# Patient Record
Sex: Female | Born: 1985
Health system: Southern US, Community
[De-identification: ages and names within clinical notes are randomized; demographics above are authoritative.]

## PROBLEM LIST (undated history)

## (undated) ENCOUNTER — Inpatient Hospital Stay (HOSPITAL_COMMUNITY): Payer: Self-pay

## (undated) DIAGNOSIS — Z8619 Personal history of other infectious and parasitic diseases: Secondary | ICD-10-CM

## (undated) DIAGNOSIS — A609 Anogenital herpesviral infection, unspecified: Secondary | ICD-10-CM

## (undated) DIAGNOSIS — R87629 Unspecified abnormal cytological findings in specimens from vagina: Secondary | ICD-10-CM

## (undated) DIAGNOSIS — F419 Anxiety disorder, unspecified: Secondary | ICD-10-CM

## (undated) HISTORY — PX: BREAST ENHANCEMENT SURGERY: SHX7

## (undated) HISTORY — PX: CYST REMOVAL HAND: SHX6279

## (undated) HISTORY — DX: Unspecified abnormal cytological findings in specimens from vagina: R87.629

## (undated) HISTORY — DX: Personal history of other infectious and parasitic diseases: Z86.19

## (undated) HISTORY — PX: BREAST SURGERY: SHX581

## (undated) HISTORY — DX: Anogenital herpesviral infection, unspecified: A60.9

---

## 2011-11-16 ENCOUNTER — Other Ambulatory Visit: Payer: Self-pay | Admitting: Family Medicine

## 2011-11-16 DIAGNOSIS — E049 Nontoxic goiter, unspecified: Secondary | ICD-10-CM

## 2011-11-20 ENCOUNTER — Other Ambulatory Visit: Payer: Self-pay

## 2011-12-11 ENCOUNTER — Ambulatory Visit
Admission: RE | Admit: 2011-12-11 | Discharge: 2011-12-11 | Disposition: A | Discharge status: Home / Self Care | Payer: 59 | Source: Ambulatory Visit | Attending: Family Medicine | Admitting: Family Medicine

## 2011-12-11 ENCOUNTER — Other Ambulatory Visit: Payer: Self-pay

## 2011-12-11 DIAGNOSIS — E049 Nontoxic goiter, unspecified: Secondary | ICD-10-CM

## 2011-12-14 ENCOUNTER — Other Ambulatory Visit: Payer: Self-pay | Admitting: Family Medicine

## 2011-12-14 DIAGNOSIS — E041 Nontoxic single thyroid nodule: Secondary | ICD-10-CM

## 2011-12-16 ENCOUNTER — Ambulatory Visit
Admission: RE | Admit: 2011-12-16 | Discharge: 2011-12-16 | Disposition: A | Discharge status: Home / Self Care | Payer: 59 | Source: Ambulatory Visit | Attending: Family Medicine | Admitting: Family Medicine

## 2011-12-16 ENCOUNTER — Other Ambulatory Visit (HOSPITAL_COMMUNITY)
Admission: RE | Admit: 2011-12-16 | Discharge: 2011-12-16 | Disposition: A | Discharge status: Home / Self Care | Payer: 59 | Source: Ambulatory Visit | Attending: Diagnostic Radiology | Admitting: Diagnostic Radiology

## 2011-12-16 DIAGNOSIS — E041 Nontoxic single thyroid nodule: Secondary | ICD-10-CM

## 2011-12-16 DIAGNOSIS — E049 Nontoxic goiter, unspecified: Secondary | ICD-10-CM | POA: Insufficient documentation

## 2012-01-13 ENCOUNTER — Encounter (HOSPITAL_COMMUNITY): Payer: Self-pay | Admitting: *Deleted

## 2012-01-13 ENCOUNTER — Emergency Department (HOSPITAL_COMMUNITY): Payer: 59

## 2012-01-13 ENCOUNTER — Emergency Department (HOSPITAL_COMMUNITY)
Admission: EM | Admit: 2012-01-13 | Discharge: 2012-01-13 | Disposition: A | Discharge status: Home / Self Care | Payer: 59 | Attending: Emergency Medicine | Admitting: Emergency Medicine

## 2012-01-13 DIAGNOSIS — R55 Syncope and collapse: Secondary | ICD-10-CM | POA: Insufficient documentation

## 2012-01-13 DIAGNOSIS — R404 Transient alteration of awareness: Secondary | ICD-10-CM | POA: Insufficient documentation

## 2012-01-13 DIAGNOSIS — F411 Generalized anxiety disorder: Secondary | ICD-10-CM | POA: Insufficient documentation

## 2012-01-13 HISTORY — DX: Anxiety disorder, unspecified: F41.9

## 2012-01-13 LAB — URINALYSIS, ROUTINE W REFLEX MICROSCOPIC
Glucose, UA: NEGATIVE mg/dL
Protein, ur: NEGATIVE mg/dL
Specific Gravity, Urine: 1.025 (ref 1.005–1.030)
pH: 6 (ref 5.0–8.0)

## 2012-01-13 LAB — COMPREHENSIVE METABOLIC PANEL
Albumin: 4.2 g/dL (ref 3.5–5.2)
BUN: 16 mg/dL (ref 6–23)
Calcium: 9.8 mg/dL (ref 8.4–10.5)
Creatinine, Ser: 0.8 mg/dL (ref 0.50–1.10)
Total Protein: 7.7 g/dL (ref 6.0–8.3)

## 2012-01-13 LAB — CBC
HCT: 40.3 % (ref 36.0–46.0)
Hemoglobin: 14.3 g/dL (ref 12.0–15.0)
WBC: 6.4 10*3/uL (ref 4.0–10.5)

## 2012-01-13 LAB — URINE MICROSCOPIC-ADD ON

## 2012-01-13 LAB — LACTIC ACID, PLASMA: Lactic Acid, Venous: 0.7 mmol/L (ref 0.5–2.2)

## 2012-01-13 LAB — POCT PREGNANCY, URINE: Preg Test, Ur: NEGATIVE

## 2012-01-13 MED ORDER — SODIUM CHLORIDE 0.9 % IV SOLN
1000.0000 mL | Freq: Once | INTRAVENOUS | Status: AC
  Administered 2012-01-13: 1000 mL via INTRAVENOUS

## 2012-01-13 MED ORDER — METOCLOPRAMIDE HCL 5 MG/ML IJ SOLN
10.0000 mg | Freq: Once | INTRAMUSCULAR | Status: AC
  Administered 2012-01-13: 10 mg via INTRAVENOUS
  Filled 2012-01-13: qty 2

## 2012-01-13 MED ORDER — KETOROLAC TROMETHAMINE 30 MG/ML IJ SOLN
30.0000 mg | Freq: Once | INTRAMUSCULAR | Status: AC
  Administered 2012-01-13: 30 mg via INTRAVENOUS
  Filled 2012-01-13: qty 1

## 2012-01-13 MED ORDER — SODIUM CHLORIDE 0.9 % IV SOLN
1000.0000 mL | INTRAVENOUS | Status: DC
  Administered 2012-01-13: 1000 mL via INTRAVENOUS

## 2012-01-13 NOTE — ED Provider Notes (Signed)
History     CSN: [id number]  Arrival date & time 01/13/12  1125   First MD Initiated Contact with Patient 01/13/12 1413      Chief Complaint  Patient presents with  . Loss of Consciousness    (Consider location/radiation/quality/duration/timing/severity/associated sxs/prior treatment) HPI Comments: Patient is a 26 y/o female with no known medical history who comes in with cc of syncope. 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 Pt reports feeling warm, + diophoresis and she had no chest pain, sob, palpitations associated with her symptoms. Pt felt like she was going to pass out, and doesn't recall some of the events. She had no nausea, emesis - but does report moderate headaches. There is no cardiac hx, syncope hx, PE, DVT, strokes. Family hx. Is unremarkable as well. Pt's LMP was in July, and she reports to no heavy bleeds.  During the event patient reports to feeling bilateral upper extremity stiffness, and bilateral lower extremity crampy pain. She also had some epigastric pain that was worse with inspiration when she was asked to lay down. All of these symptoms have since resolved spontaneously.  I spoke with the nurse preceptor Ms. Joy, and she mentions that patient started feeling warm, was diophoretic and turned pale. Pt never really passed out, but mentioned that she felt like she was about to faint. Pt's BP initially was 80s/60s and improved to 90s/60s. She was never tachycardic, hypoxic.  Patient is a 26 y.o. female presenting with syncope. The history is provided by the patient.  Loss of Consciousness Pertinent negatives include no chest pain, no abdominal pain and no shortness of breath.    Past Medical History  Diagnosis Date  . Anxiety     History reviewed. No pertinent past surgical history.  History reviewed. No pertinent family history.  History    Substance Use Topics  . Smoking status: Never Smoker   . Smokeless tobacco: Not on file  . Alcohol Use: No    OB History    Grav Para Term Preterm Abortions TAB SAB Ect Mult Living                  Review of Systems  Constitutional: Negative for fever, chills and diaphoresis.  HENT: Negative for neck pain.   Eyes: Negative for pain.  Respiratory: Negative for chest tightness and shortness of breath.   Cardiovascular: Positive for syncope. Negative for chest pain, palpitations and leg swelling.  Gastrointestinal: Negative for nausea, vomiting and abdominal pain.  Genitourinary: Negative for frequency.  Musculoskeletal: Negative for gait problem.  Skin: Positive for color change.  Neurological: Negative for dizziness and light-headedness.  Hematological: Does not bruise/bleed easily.    Allergies  Review of patient's allergies indicates no known allergies.  Home Medications   Current Outpatient Rx  Name Route Sig Dispense Refill  . DESVENLAFAXINE SUCCINATE ER 50 MG PO TB24 Oral Take 50 mg by mouth daily.      BP 109/76  Pulse 77  Temp 97.9 F (36.6 C) (Oral)  Resp 16  SpO2 99%  LMP 01/04/2012  Physical Exam  Constitutional: She is oriented to person, place, and time. She appears well-developed.  HENT:  Head: Normocephalic and atraumatic.  Eyes: EOM are normal. Pupils are equal, round, and reactive to light.  Neck: Neck supple.  Cardiovascular: Normal rate and regular rhythm.   No murmur heard. Pulmonary/Chest:  Effort normal and breath sounds normal. No respiratory distress.  Abdominal: Soft. Bowel sounds are normal. She exhibits no distension. There is no tenderness. There is no rebound and no guarding.  Musculoskeletal: Normal range of motion.  Neurological: She is alert and oriented to person, place, and time. She has normal strength. She displays no tremor. No cranial nerve deficit or sensory deficit. She exhibits normal muscle tone. Coordination and gait  normal.  Skin: Skin is warm and dry.  Psychiatric: She has a normal mood and affect.    ED Course  Procedures (including critical care time)  Labs Reviewed  COMPREHENSIVE METABOLIC PANEL - Abnormal; Notable for the following:    Glucose, Bld 103 (*)     All other components within normal limits  CBC  POCT PREGNANCY, URINE  URINALYSIS, ROUTINE W REFLEX MICROSCOPIC  POCT CBG (FASTING - GLUCOSE)-MANUAL ENTRY  PREGNANCY, URINE  URINALYSIS, DIPSTICK ONLY   No results found.   No diagnosis found.    MDM   26 y/o healthy woman with no medical history and no ACS risk factors or family hx with of premature CAD comes in with near syncope. Pt had some epigastric chest pain, crampy legs, and had a recent travel via plane to [place]. DDx includes ACS, PE, myocarditis, valcular disorder, neurocardiogenic syncope, TIA, Anemia.  Based on the hx and exam, we favor the dx of neurocardiogenic syncope. Pt is low risk for PE per wells and we will get d-dimer. EKG looks WNL, troponin ordered.   Date: 01/13/2012  Rate: 69  Rhythm: normal sinus rhythm  QRS Axis: normal  Intervals: normal  ST/T Wave abnormalities: normal  Conduction Disutrbances:none  Narrative Interpretation:   Old EKG Reviewed: none available    6:40 PM Pt's headache improved, now feelsl ike a pressure. Pt has been asymptomatic in the ED. Pt's BP are borderline low, but she has been having no complains, usually runs in the 100s. Orthostatic is negative, d-dimer is negative. Troponin was missed - and i ordered that with a lactate to make sure perfusion at the tissue level is also WNL. Repeat questioning leads to no source of infection. Pt is able to see a pcp this week. If pending labs normal, will discharge.  Derwood Kaplan, MD 01/13/12 2131

## 2012-01-13 NOTE — ED Notes (Signed)
[facility], had syncopal episode, at that time had cramping to bilateral hands and legs. Is followed by headache, reports epigastric pain when lying down. cbg 103 pta. No acute distress noted at this time.

## 2012-01-13 NOTE — ED Notes (Signed)
Patient transported to X-ray 

## 2013-07-06 NOTE — L&D Delivery Note (Signed)
Delivery Note At 4:09 PM a viable female was delivered via OA Presentation Apgars 9 9 weight pending Placenta status:spontaneously with 3 vessel cord , .  Cord:  with the following complications:none .  Cord pH: not obtained  Anesthesia: Epidural  Episiotomy: none Lacerations: none Suture Repair: 3.0 chromic Est. Blood Loss (mL): 300  Mom to postpartum.  Baby to Couplet care / Skin to Skin.  Kristeen Lantz L 03/02/2014, 4:19 PM

## 2013-08-02 LAB — OB RESULTS CONSOLE ANTIBODY SCREEN: Antibody Screen: NEGATIVE

## 2013-08-02 LAB — OB RESULTS CONSOLE RPR: RPR: NONREACTIVE

## 2013-08-02 LAB — OB RESULTS CONSOLE HEPATITIS B SURFACE ANTIGEN: Hepatitis B Surface Ag: NEGATIVE

## 2013-08-02 LAB — OB RESULTS CONSOLE ABO/RH: RH Type: POSITIVE

## 2013-08-02 LAB — OB RESULTS CONSOLE HIV ANTIBODY (ROUTINE TESTING): HIV: NONREACTIVE

## 2013-08-02 LAB — OB RESULTS CONSOLE RUBELLA ANTIBODY, IGM: Rubella: IMMUNE

## 2013-08-02 LAB — OB RESULTS CONSOLE GC/CHLAMYDIA
CHLAMYDIA, DNA PROBE: NEGATIVE
Gonorrhea: NEGATIVE

## 2014-01-31 LAB — OB RESULTS CONSOLE GBS: STREP GROUP B AG: NEGATIVE

## 2014-02-12 ENCOUNTER — Inpatient Hospital Stay (HOSPITAL_COMMUNITY)
Admission: AD | Admit: 2014-02-12 | Discharge: 2014-02-12 | Disposition: A | Discharge status: Home / Self Care | Payer: 59 | Source: Ambulatory Visit | Attending: Obstetrics and Gynecology | Admitting: Obstetrics and Gynecology

## 2014-02-12 ENCOUNTER — Encounter (HOSPITAL_COMMUNITY): Payer: Self-pay | Admitting: *Deleted

## 2014-02-12 DIAGNOSIS — R0602 Shortness of breath: Secondary | ICD-10-CM | POA: Diagnosis present

## 2014-02-12 DIAGNOSIS — O26891 Other specified pregnancy related conditions, first trimester: Secondary | ICD-10-CM

## 2014-02-12 DIAGNOSIS — R0600 Dyspnea, unspecified: Secondary | ICD-10-CM

## 2014-02-12 DIAGNOSIS — R0609 Other forms of dyspnea: Secondary | ICD-10-CM

## 2014-02-12 DIAGNOSIS — O9989 Other specified diseases and conditions complicating pregnancy, childbirth and the puerperium: Secondary | ICD-10-CM

## 2014-02-12 DIAGNOSIS — R0989 Other specified symptoms and signs involving the circulatory and respiratory systems: Secondary | ICD-10-CM

## 2014-02-12 DIAGNOSIS — O99891 Other specified diseases and conditions complicating pregnancy: Secondary | ICD-10-CM | POA: Diagnosis not present

## 2014-02-12 LAB — URINALYSIS, ROUTINE W REFLEX MICROSCOPIC
BILIRUBIN URINE: NEGATIVE
Bilirubin Urine: NEGATIVE
GLUCOSE, UA: NEGATIVE mg/dL
Glucose, UA: NEGATIVE mg/dL
HGB URINE DIPSTICK: NEGATIVE
HGB URINE DIPSTICK: NEGATIVE
Ketones, ur: 15 mg/dL — AB
Ketones, ur: 15 mg/dL — AB
LEUKOCYTES UA: NEGATIVE
NITRITE: NEGATIVE
Nitrite: NEGATIVE
PROTEIN: 30 mg/dL — AB
Protein, ur: NEGATIVE mg/dL
Specific Gravity, Urine: 1.025 (ref 1.005–1.030)
Specific Gravity, Urine: 1.025 (ref 1.005–1.030)
UROBILINOGEN UA: 1 mg/dL (ref 0.0–1.0)
Urobilinogen, UA: 0.2 mg/dL (ref 0.0–1.0)
pH: 6 (ref 5.0–8.0)
pH: 6 (ref 5.0–8.0)

## 2014-02-12 LAB — URINE MICROSCOPIC-ADD ON

## 2014-02-12 LAB — CBC WITH DIFFERENTIAL/PLATELET
BASOS ABS: 0 10*3/uL (ref 0.0–0.1)
BASOS PCT: 0 % (ref 0–1)
EOS ABS: 0.1 10*3/uL (ref 0.0–0.7)
Eosinophils Relative: 2 % (ref 0–5)
HEMATOCRIT: 33.6 % — AB (ref 36.0–46.0)
HEMOGLOBIN: 11.6 g/dL — AB (ref 12.0–15.0)
Lymphocytes Relative: 19 % (ref 12–46)
Lymphs Abs: 1.4 10*3/uL (ref 0.7–4.0)
MCH: 31.1 pg (ref 26.0–34.0)
MCHC: 34.5 g/dL (ref 30.0–36.0)
MCV: 90.1 fL (ref 78.0–100.0)
MONO ABS: 0.7 10*3/uL (ref 0.1–1.0)
MONOS PCT: 9 % (ref 3–12)
NEUTROS ABS: 5.2 10*3/uL (ref 1.7–7.7)
NEUTROS PCT: 70 % (ref 43–77)
Platelets: 171 10*3/uL (ref 150–400)
RBC: 3.73 MIL/uL — ABNORMAL LOW (ref 3.87–5.11)
RDW: 12.6 % (ref 11.5–15.5)
WBC: 7.5 10*3/uL (ref 4.0–10.5)

## 2014-02-12 NOTE — MAU Provider Note (Signed)
History     CSN: [id number]  Arrival date and time: 02/12/14 1107   First Provider Initiated Contact with Patient 02/12/14 1152      Chief Complaint  Patient presents with  . Shortness of Breath  . Tachycardia   HPI   Stacy Marquez is 28 y.o. female G3P1011 at [redacted]w[redacted]d who presents with complaints of shortness of breath and tachycardia. She first noticed SOB this morning; denies chest pain. She is also experiencing increased HR associated with the SOB.  She does have a history of anxiety; stopped taking her medication when she became pregnant. She has had on prior episode of tachycardia in early pregnancy. Her HR is normally just under 115 bmp; she feels more recently her HR has been at times in the 140's.   She does not drink much water or fluids during the day.  She drinks 1-2 cans of soda per day and drinks sweet tea during the day.   OB History   Grav Para Term Preterm Abortions TAB SAB Ect Mult Living   3 1 1  1  1   1       Past Medical History  Diagnosis Date  . Anxiety     Past Surgical History  Procedure Laterality Date  . Breast enhancement surgery      History reviewed. No pertinent family history.  History  Substance Use Topics  . Smoking status: Never Smoker   . Smokeless tobacco: Not on file  . Alcohol Use: No    Allergies: No Known Allergies  Prescriptions prior to admission  Medication Sig Dispense Refill  . Melatonin 5 MG CAPS Take 10 mg by mouth daily.       Results for orders placed during the hospital encounter of 02/12/14 (from the past 48 hour(s))  URINALYSIS, ROUTINE W REFLEX MICROSCOPIC     Status: Abnormal   Collection Time    02/12/14 11:45 AM      Result Value Ref Range   Color, Urine YELLOW  YELLOW   APPearance HAZY (*) CLEAR   Specific Gravity, Urine 1.025  1.005 - 1.030   pH 6.0  5.0 - 8.0   Glucose, UA NEGATIVE  NEGATIVE mg/dL   Hgb urine dipstick NEGATIVE  NEGATIVE   Bilirubin Urine NEGATIVE  NEGATIVE   Ketones, ur 15  (*) NEGATIVE mg/dL   Protein, ur 30 (*) NEGATIVE mg/dL   Urobilinogen, UA 1.0  0.0 - 1.0 mg/dL   Nitrite NEGATIVE  NEGATIVE   Leukocytes, UA TRACE (*) NEGATIVE  URINE MICROSCOPIC-ADD ON     Status: Abnormal   Collection Time    02/12/14 11:45 AM      Result Value Ref Range   Squamous Epithelial / LPF MANY (*) RARE   WBC, UA 0-2  <3 WBC/hpf   Bacteria, UA MANY (*) RARE   Urine-Other MUCOUS PRESENT    CBC WITH DIFFERENTIAL     Status: Abnormal   Collection Time    02/12/14 11:50 AM      Result Value Ref Range   WBC 7.5  4.0 - 10.5 K/uL   RBC 3.73 (*) 3.87 - 5.11 MIL/uL   Hemoglobin 11.6 (*) 12.0 - 15.0 g/dL   HCT 33.6 (*) 36.0 - 46.0 %   MCV 90.1  78.0 - 100.0 fL   MCH 31.1  26.0 - 34.0 pg   MCHC 34.5  30.0 - 36.0 g/dL   RDW 12.6  11.5 - 15.5 %   Platelets 171  150 -  400 K/uL   Neutrophils Relative % 70  43 - 77 %   Neutro Abs 5.2  1.7 - 7.7 K/uL   Lymphocytes Relative 19  12 - 46 %   Lymphs Abs 1.4  0.7 - 4.0 K/uL   Monocytes Relative 9  3 - 12 %   Monocytes Absolute 0.7  0.1 - 1.0 K/uL   Eosinophils Relative 2  0 - 5 %   Eosinophils Absolute 0.1  0.0 - 0.7 K/uL   Basophils Relative 0  0 - 1 %   Basophils Absolute 0.0  0.0 - 0.1 K/uL  URINALYSIS, ROUTINE W REFLEX MICROSCOPIC     Status: Abnormal   Collection Time    02/12/14  1:28 PM      Result Value Ref Range   Color, Urine YELLOW  YELLOW   APPearance CLEAR  CLEAR   Specific Gravity, Urine 1.025  1.005 - 1.030   pH 6.0  5.0 - 8.0   Glucose, UA NEGATIVE  NEGATIVE mg/dL   Hgb urine dipstick NEGATIVE  NEGATIVE   Bilirubin Urine NEGATIVE  NEGATIVE   Ketones, ur 15 (*) NEGATIVE mg/dL   Protein, ur NEGATIVE  NEGATIVE mg/dL   Urobilinogen, UA 0.2  0.0 - 1.0 mg/dL   Nitrite NEGATIVE  NEGATIVE   Leukocytes, UA NEGATIVE  NEGATIVE   Comment: MICROSCOPIC NOT DONE ON URINES WITH NEGATIVE PROTEIN, BLOOD, LEUKOCYTES, NITRITE, OR GLUCOSE <1000 mg/dL.     Review of Systems  Constitutional: Negative for fever and chills.   Eyes: Negative for blurred vision.  Respiratory: Positive for shortness of breath.   Cardiovascular: Negative for chest pain and palpitations.       Increased HR  Neurological: Positive for dizziness. Negative for weakness.   Physical Exam   Blood pressure 101/67, pulse 109, temperature 97.8 F (36.6 C), temperature source Oral, resp. rate 22, SpO2 100.00%.  Physical Exam  Constitutional: She is oriented to person, place, and time. She appears well-developed and well-nourished.  Non-toxic appearance. She does not have a sickly appearance. She does not appear ill. No distress.  Eyes: Pupils are equal, round, and reactive to light.  Neck: Normal range of motion. Neck supple.  Cardiovascular: Normal rate, regular rhythm and normal heart sounds.   Respiratory: Effort normal and breath sounds normal. No respiratory distress. She has no wheezes. She has no rales. She exhibits no tenderness.  GI: Soft.  Neurological: She is alert and oriented to person, place, and time.  Skin: Skin is warm. She is not diaphoretic.  Psychiatric: Her speech is normal and behavior is normal. Her mood appears anxious.    Fetal Tracing: Baseline: 145 bpm  Variability: Moderate  Accelerations: 15x15 Decelerations: None Toco: UI    MAU Course  Procedures None  MDM UA shows mild dehydration; 15 of ketones Will have patient repeat UA due to + protein however many squamous cells.    Discussed plan of care with Dr.Holland.  EKG; normal sinus rhythm.  PO hydration  Orthostatic vital signs  1425: Discussed EKG, NST and CBC with Dr. Matthew Saras.  Pt feels better upon discharge home. And is instructed to follow up in the office tomorrow.   Assessment and Plan   A:  1. Shortness of breath due to pregnancy, first trimester    P:  Discharge home in stable condition Call the office tomorrow to schedule a follow up appointment  Return to MAU if symptoms worsen  Increase daily water intake.   RASCH,  JENNIFER IRENE  02/12/2014, 11:57 AM

## 2014-02-12 NOTE — MAU Note (Signed)
Pt states she began having rapid HR & SOB this a.m. While getting ready.  HR registered from 108-140 on her Fit Bit.  Pt denies chest pain but is having SOB with any exertion at all.  Denies bleeding or LOF, has occasional mild uc's.

## 2014-02-25 ENCOUNTER — Inpatient Hospital Stay (HOSPITAL_COMMUNITY)
Admission: AD | Admit: 2014-02-25 | Discharge: 2014-02-25 | Disposition: A | Discharge status: Home / Self Care | Payer: 59 | Source: Ambulatory Visit | Attending: Obstetrics and Gynecology | Admitting: Obstetrics and Gynecology

## 2014-02-25 ENCOUNTER — Encounter (HOSPITAL_COMMUNITY): Payer: Self-pay | Admitting: *Deleted

## 2014-02-25 DIAGNOSIS — O479 False labor, unspecified: Secondary | ICD-10-CM | POA: Diagnosis not present

## 2014-02-25 NOTE — Discharge Instructions (Signed)
Braxton Hicks Contractions °Contractions of the uterus can occur throughout pregnancy. Contractions are not always a sign that you are in labor.  °WHAT ARE BRAXTON HICKS CONTRACTIONS?  °Contractions that occur before labor are called Braxton Hicks contractions, or false labor. Toward the end of pregnancy (32-34 weeks), these contractions can develop more often and may become more forceful. This is not true labor because these contractions do not result in opening (dilatation) and thinning of the cervix. They are sometimes difficult to tell apart from true labor because these contractions can be forceful and people have different pain tolerances. You should not feel embarrassed if you go to the hospital with false labor. Sometimes, the only way to tell if you are in true labor is for your health care provider to look for changes in the cervix. °If there are no prenatal problems or other health problems associated with the pregnancy, it is completely safe to be sent home with false labor and await the onset of true labor. °HOW CAN YOU TELL THE DIFFERENCE BETWEEN TRUE AND FALSE LABOR? °False Labor °· The contractions of false labor are usually shorter and not as hard as those of true labor.   °· The contractions are usually irregular.   °· The contractions are often felt in the front of the lower abdomen and in the groin.   °· The contractions may go away when you walk around or change positions while lying down.   °· The contractions get weaker and are shorter lasting as time goes on.   °· The contractions do not usually become progressively stronger, regular, and closer together as with true labor.   °True Labor °· Contractions in true labor last 30-70 seconds, become very regular, usually become more intense, and increase in frequency.   °· The contractions do not go away with walking.   °· The discomfort is usually felt in the top of the uterus and spreads to the lower abdomen and low back.   °· True labor can be  determined by your health care provider with an exam. This will show that the cervix is dilating and getting thinner.   °WHAT TO REMEMBER °· Keep up with your usual exercises and follow other instructions given by your health care provider.   °· Take medicines as directed by your health care provider.   °· Keep your regular prenatal appointments.   °· Eat and drink lightly if you think you are going into labor.   °· If Braxton Hicks contractions are making you uncomfortable:   °¨ Change your position from lying down or resting to walking, or from walking to resting.   °¨ Sit and rest in a tub of warm water.   °¨ Drink 2-3 glasses of water. Dehydration may cause these contractions.   °¨ Do slow and deep breathing several times an hour.   °WHEN SHOULD I SEEK IMMEDIATE MEDICAL CARE? °Seek immediate medical care if: °· Your contractions become stronger, more regular, and closer together.   °· You have fluid leaking or gushing from your vagina.   °· You have a fever.   °· You pass blood-tinged mucus.   °· You have vaginal bleeding.   °· You have continuous abdominal pain.   °· You have low back pain that you never had before.   °· You feel your baby's head pushing down and causing pelvic pressure.   °· Your baby is not moving as much as it used to.   °Document Released: 06/22/2005 Document Revised: 06/27/2013 Document Reviewed: 04/03/2013 °[employer]® Patient Information ©2015 [employer]. This information is not intended to replace advice given to you by your health care   provider. Make sure you discuss any questions you have with your health care provider. ° °

## 2014-02-25 NOTE — MAU Note (Signed)
Pt presents to MAU with complaints of contractions throughout the day and reports she lost her mucus plug at work today

## 2014-02-28 ENCOUNTER — Encounter (HOSPITAL_COMMUNITY): Payer: Self-pay | Admitting: *Deleted

## 2014-02-28 ENCOUNTER — Telehealth (HOSPITAL_COMMUNITY): Payer: Self-pay | Admitting: *Deleted

## 2014-02-28 NOTE — Telephone Encounter (Signed)
Preadmission screen  

## 2014-03-02 ENCOUNTER — Inpatient Hospital Stay (HOSPITAL_COMMUNITY)
Admission: RE | Admit: 2014-03-02 | Discharge: 2014-03-03 | DRG: 774 | Disposition: A | Discharge status: Home / Self Care | Payer: 59 | Source: Ambulatory Visit | Attending: Obstetrics and Gynecology | Admitting: Obstetrics and Gynecology

## 2014-03-02 ENCOUNTER — Encounter (HOSPITAL_COMMUNITY): Payer: Self-pay

## 2014-03-02 ENCOUNTER — Encounter (HOSPITAL_COMMUNITY): Payer: 59 | Admitting: Anesthesiology

## 2014-03-02 ENCOUNTER — Inpatient Hospital Stay (HOSPITAL_COMMUNITY): Payer: 59 | Admitting: Anesthesiology

## 2014-03-02 DIAGNOSIS — Z833 Family history of diabetes mellitus: Secondary | ICD-10-CM

## 2014-03-02 DIAGNOSIS — Z823 Family history of stroke: Secondary | ICD-10-CM | POA: Diagnosis not present

## 2014-03-02 DIAGNOSIS — A6000 Herpesviral infection of urogenital system, unspecified: Secondary | ICD-10-CM | POA: Diagnosis present

## 2014-03-02 DIAGNOSIS — O98519 Other viral diseases complicating pregnancy, unspecified trimester: Principal | ICD-10-CM | POA: Diagnosis present

## 2014-03-02 DIAGNOSIS — O99344 Other mental disorders complicating childbirth: Secondary | ICD-10-CM | POA: Diagnosis present

## 2014-03-02 DIAGNOSIS — F411 Generalized anxiety disorder: Secondary | ICD-10-CM | POA: Diagnosis present

## 2014-03-02 DIAGNOSIS — O99891 Other specified diseases and conditions complicating pregnancy: Secondary | ICD-10-CM | POA: Diagnosis present

## 2014-03-02 LAB — CBC
HEMATOCRIT: 33.1 % — AB (ref 36.0–46.0)
Hemoglobin: 11.3 g/dL — ABNORMAL LOW (ref 12.0–15.0)
MCH: 30.5 pg (ref 26.0–34.0)
MCHC: 34.1 g/dL (ref 30.0–36.0)
MCV: 89.2 fL (ref 78.0–100.0)
PLATELETS: 161 10*3/uL (ref 150–400)
RBC: 3.71 MIL/uL — ABNORMAL LOW (ref 3.87–5.11)
RDW: 12.7 % (ref 11.5–15.5)
WBC: 7.9 10*3/uL (ref 4.0–10.5)

## 2014-03-02 LAB — RPR

## 2014-03-02 MED ORDER — DIPHENHYDRAMINE HCL 50 MG/ML IJ SOLN: 12.5000 mg | INTRAMUSCULAR | Status: DC | PRN

## 2014-03-02 MED ORDER — FENTANYL 2.5 MCG/ML BUPIVACAINE 1/10 % EPIDURAL INFUSION (WH - ANES)
14.0000 mL/h | INTRAMUSCULAR | Status: DC | PRN
  Administered 2014-03-02: 14 mL/h via EPIDURAL
  Filled 2014-03-02: qty 125

## 2014-03-02 MED ORDER — PHENYLEPHRINE 40 MCG/ML (10ML) SYRINGE FOR IV PUSH (FOR BLOOD PRESSURE SUPPORT): 80.0000 ug | PREFILLED_SYRINGE | INTRAVENOUS | Status: DC | PRN

## 2014-03-02 MED ORDER — LACTATED RINGERS IV SOLN: 500.0000 mL | INTRAVENOUS | Status: DC | PRN

## 2014-03-02 MED ORDER — MEASLES, MUMPS & RUBELLA VAC ~~LOC~~ INJ
0.5000 mL | INJECTION | Freq: Once | SUBCUTANEOUS | Status: DC
  Filled 2014-03-02: qty 0.5

## 2014-03-02 MED ORDER — DIBUCAINE 1 % RE OINT
1.0000 "application " | TOPICAL_OINTMENT | RECTAL | Status: DC | PRN
  Filled 2014-03-02: qty 28

## 2014-03-02 MED ORDER — FLEET ENEMA 7-19 GM/118ML RE ENEM: 1.0000 | ENEMA | Freq: Every day | RECTAL | Status: DC | PRN

## 2014-03-02 MED ORDER — SIMETHICONE 80 MG PO CHEW
80.0000 mg | CHEWABLE_TABLET | ORAL | Status: DC | PRN
Start: 2014-03-02 — End: 2014-03-03

## 2014-03-02 MED ORDER — ACETAMINOPHEN 325 MG PO TABS: 650.0000 mg | ORAL_TABLET | ORAL | Status: DC | PRN

## 2014-03-02 MED ORDER — IBUPROFEN 600 MG PO TABS: 600.0000 mg | ORAL_TABLET | Freq: Four times a day (QID) | ORAL | Status: DC | PRN

## 2014-03-02 MED ORDER — ONDANSETRON HCL 4 MG/2ML IJ SOLN: 4.0000 mg | Freq: Four times a day (QID) | INTRAMUSCULAR | Status: DC | PRN

## 2014-03-02 MED ORDER — LANOLIN HYDROUS EX OINT
TOPICAL_OINTMENT | CUTANEOUS | Status: DC | PRN
Start: 2014-03-02 — End: 2014-03-03

## 2014-03-02 MED ORDER — DIPHENHYDRAMINE HCL 25 MG PO CAPS
25.0000 mg | ORAL_CAPSULE | Freq: Four times a day (QID) | ORAL | Status: DC | PRN
Start: 2014-03-02 — End: 2014-03-03
  Administered 2014-03-02: 25 mg via ORAL
  Filled 2014-03-02: qty 1

## 2014-03-02 MED ORDER — PHENYLEPHRINE 40 MCG/ML (10ML) SYRINGE FOR IV PUSH (FOR BLOOD PRESSURE SUPPORT)
80.0000 ug | PREFILLED_SYRINGE | INTRAVENOUS | Status: DC | PRN
  Filled 2014-03-02: qty 10

## 2014-03-02 MED ORDER — VENLAFAXINE HCL ER 75 MG PO CP24
75.0000 mg | ORAL_CAPSULE | Freq: Every day | ORAL | Status: DC
  Administered 2014-03-03: 75 mg via ORAL
  Filled 2014-03-02 (×2): qty 1

## 2014-03-02 MED ORDER — OXYTOCIN 40 UNITS IN LACTATED RINGERS INFUSION - SIMPLE MED
1.0000 m[IU]/min | INTRAVENOUS | Status: DC
  Administered 2014-03-02: 2 m[IU]/min via INTRAVENOUS
  Filled 2014-03-02: qty 1000

## 2014-03-02 MED ORDER — OXYTOCIN 40 UNITS IN LACTATED RINGERS INFUSION - SIMPLE MED: 62.5000 mL/h | INTRAVENOUS | Status: DC

## 2014-03-02 MED ORDER — OXYTOCIN BOLUS FROM INFUSION: 500.0000 mL | INTRAVENOUS | Status: DC

## 2014-03-02 MED ORDER — FENTANYL 2.5 MCG/ML BUPIVACAINE 1/10 % EPIDURAL INFUSION (WH - ANES): 14.0000 mL/h | INTRAMUSCULAR | Status: DC | PRN

## 2014-03-02 MED ORDER — MEDROXYPROGESTERONE ACETATE 150 MG/ML IM SUSP: 150.0000 mg | INTRAMUSCULAR | Status: DC | PRN

## 2014-03-02 MED ORDER — LACTATED RINGERS IV SOLN
INTRAVENOUS | Status: DC
  Administered 2014-03-02 (×2): via INTRAVENOUS

## 2014-03-02 MED ORDER — BISACODYL 10 MG RE SUPP
10.0000 mg | Freq: Every day | RECTAL | Status: DC | PRN
  Filled 2014-03-02: qty 1

## 2014-03-02 MED ORDER — CITRIC ACID-SODIUM CITRATE 334-500 MG/5ML PO SOLN: 30.0000 mL | ORAL | Status: DC | PRN

## 2014-03-02 MED ORDER — EPHEDRINE 5 MG/ML INJ: 10.0000 mg | INTRAVENOUS | Status: DC | PRN

## 2014-03-02 MED ORDER — LIDOCAINE HCL (PF) 1 % IJ SOLN
INTRAMUSCULAR | Status: DC | PRN
  Administered 2014-03-02: 10 mL

## 2014-03-02 MED ORDER — TERBUTALINE SULFATE 1 MG/ML IJ SOLN: 0.2500 mg | Freq: Once | INTRAMUSCULAR | Status: DC | PRN

## 2014-03-02 MED ORDER — OXYCODONE-ACETAMINOPHEN 5-325 MG PO TABS: 1.0000 | ORAL_TABLET | ORAL | Status: DC | PRN

## 2014-03-02 MED ORDER — ZOLPIDEM TARTRATE 5 MG PO TABS: 5.0000 mg | ORAL_TABLET | Freq: Every evening | ORAL | Status: DC | PRN

## 2014-03-02 MED ORDER — LIDOCAINE HCL (PF) 1 % IJ SOLN: 30.0000 mL | INTRAMUSCULAR | Status: DC | PRN

## 2014-03-02 MED ORDER — WITCH HAZEL-GLYCERIN EX PADS
1.0000 "application " | MEDICATED_PAD | CUTANEOUS | Status: DC | PRN
  Administered 2014-03-03 (×2): 1 via TOPICAL

## 2014-03-02 MED ORDER — IBUPROFEN 600 MG PO TABS
600.0000 mg | ORAL_TABLET | Freq: Four times a day (QID) | ORAL | Status: DC
  Administered 2014-03-02 – 2014-03-03 (×4): 600 mg via ORAL
  Filled 2014-03-02 (×4): qty 1

## 2014-03-02 MED ORDER — SENNOSIDES-DOCUSATE SODIUM 8.6-50 MG PO TABS
2.0000 | ORAL_TABLET | ORAL | Status: DC
  Administered 2014-03-02: 2 via ORAL
  Filled 2014-03-02: qty 2

## 2014-03-02 MED ORDER — BENZOCAINE-MENTHOL 20-0.5 % EX AERO
1.0000 | INHALATION_SPRAY | CUTANEOUS | Status: DC | PRN
Start: 2014-03-02 — End: 2014-03-03
  Administered 2014-03-02: 1 via TOPICAL
  Filled 2014-03-02 (×2): qty 56

## 2014-03-02 MED ORDER — FLEET ENEMA 7-19 GM/118ML RE ENEM: 1.0000 | ENEMA | RECTAL | Status: DC | PRN

## 2014-03-02 MED ORDER — PRENATAL MULTIVITAMIN CH
1.0000 | ORAL_TABLET | Freq: Every day | ORAL | Status: DC
  Administered 2014-03-03: 1 via ORAL
  Filled 2014-03-02: qty 1

## 2014-03-02 MED ORDER — TETANUS-DIPHTH-ACELL PERTUSSIS 5-2.5-18.5 LF-MCG/0.5 IM SUSP
0.5000 mL | Freq: Once | INTRAMUSCULAR | Status: DC
  Filled 2014-03-02: qty 0.5

## 2014-03-02 MED ORDER — ONDANSETRON HCL 4 MG/2ML IJ SOLN
4.0000 mg | INTRAMUSCULAR | Status: DC | PRN
Start: 2014-03-02 — End: 2014-03-03

## 2014-03-02 MED ORDER — ONDANSETRON HCL 4 MG PO TABS: 4.0000 mg | ORAL_TABLET | ORAL | Status: DC | PRN

## 2014-03-02 MED ORDER — OXYCODONE-ACETAMINOPHEN 5-325 MG PO TABS
1.0000 | ORAL_TABLET | ORAL | Status: DC | PRN
  Administered 2014-03-03 (×2): 1 via ORAL
  Filled 2014-03-02 (×2): qty 1

## 2014-03-02 MED ORDER — LACTATED RINGERS IV SOLN: 500.0000 mL | Freq: Once | INTRAVENOUS | Status: DC

## 2014-03-02 NOTE — Anesthesia Procedure Notes (Signed)

## 2014-03-02 NOTE — Anesthesia Preprocedure Evaluation (Signed)
Anesthesia Evaluation Anesthesia Physical Anesthesia Plan  ASA: II  Anesthesia Plan: Epidural   Post-op Pain Management:    Induction:   Airway Management Planned:   Additional Equipment:   Intra-op Plan:   Post-operative Plan:   Informed Consent: I have reviewed the patients History and Physical, chart, labs and discussed the procedure including the risks, benefits and alternatives for the proposed anesthesia with the patient or authorized representative who has indicated his/her understanding and acceptance.   Dental Advisory Given  Plan Discussed with:   Anesthesia Plan Comments: (Labs checked- platelets confirmed with RN in room. Fetal heart tracing, per RN, reported to be stable enough for sitting procedure. Discussed epidural, and patient consents to the procedure:  included risk of possible headache,backache, failed block, allergic reaction, and nerve injury. This patient was asked if she had any questions or concerns before the procedure started.)        Anesthesia Quick Evaluation

## 2014-03-02 NOTE — H&P (Signed)
Stacy Marquez is a 28 y.o. G 3 P 1011 at 39 weeks and 4 days presents for elective induction.History of HSV no current outbreak. History of vaginal delivery - 14 hour labor. Maternal Medical History:  Fetal activity: Perceived fetal activity is normal.      OB History   Grav Para Term Preterm Abortions TAB SAB Ect Mult Living   3 1 1  1  1   1      Past Medical History  Diagnosis Date  . Anxiety   . HSV (herpes simplex virus) anogenital infection     occ vulvar outbreaks  . Vaginal Pap smear, abnormal   . Hx of varicella    Past Surgical History  Procedure Laterality Date  . Breast enhancement surgery    . Breast surgery      aug [id number]  . Cyst removal hand     Family History: family history includes Cancer in her maternal grandmother and maternal uncle; Diabetes in her maternal grandfather and maternal grandmother; Stroke in her maternal grandfather. Social History:  reports that she has never smoked. She does not have any smokeless tobacco history on file. She reports that she does not drink alcohol or use illicit drugs.   Prenatal Transfer Tool  Maternal Diabetes: No Genetic Screening: Normal Maternal Ultrasounds/Referrals: Normal Fetal Ultrasounds or other Referrals:  None Maternal Substance Abuse:  No Significant Maternal Medications:  None Significant Maternal Lab Results:  None Other Comments:  None  Review of Systems  All other systems reviewed and are negative.   Dilation: 3 Effacement (%): 80 Station: -2 Exam by:: Kawehi Hostetter Blood pressure 107/66, pulse 88, resp. rate 20, height 5\' 6"  (1.676 m), weight 76.204 kg (168 lb). Maternal Exam:  Abdomen: Fetal presentation: vertex     Fetal Exam Fetal State Assessment: Category I - tracings are normal.     Physical Exam  Nursing note and vitals reviewed. Constitutional: She appears well-developed and well-nourished.  HENT:  Head: Normocephalic.  Eyes: Pupils are equal, round, and reactive to light.   Neck: Normal range of motion.  Cardiovascular: Normal rate and regular rhythm.   Respiratory: Effort normal.    Prenatal labs: ABO, Rh: A/Positive/-- (01/28 0000) Antibody: Negative (01/28 0000) Rubella: Immune (01/28 0000) RPR: Nonreactive (01/28 0000)  HBsAg: Negative (01/28 0000)  HIV: Non-reactive (01/28 0000)  GBS: Negative (07/29 0000)   Assessment/Plan: IUP at term Favorable cervix Epidural Pitocin prn Vendela Troung L 03/02/2014, 8:15 AM

## 2014-03-03 LAB — CBC
HEMATOCRIT: 31.2 % — AB (ref 36.0–46.0)
Hemoglobin: 10.6 g/dL — ABNORMAL LOW (ref 12.0–15.0)
MCH: 29.9 pg (ref 26.0–34.0)
MCHC: 34 g/dL (ref 30.0–36.0)
MCV: 88.1 fL (ref 78.0–100.0)
PLATELETS: 150 10*3/uL (ref 150–400)
RBC: 3.54 MIL/uL — ABNORMAL LOW (ref 3.87–5.11)
RDW: 12.7 % (ref 11.5–15.5)
WBC: 9.1 10*3/uL (ref 4.0–10.5)

## 2014-03-03 MED ORDER — DESVENLAFAXINE SUCCINATE ER 50 MG PO TB24: 50.0000 mg | ORAL_TABLET | Freq: Every day | ORAL | Status: AC

## 2014-03-03 MED ORDER — IBUPROFEN 600 MG PO TABS: 600.0000 mg | ORAL_TABLET | Freq: Four times a day (QID) | ORAL | Status: AC

## 2014-03-03 NOTE — Discharge Summary (Signed)
Obstetric Discharge Summary Reason for Admission: induction of labor Prenatal Procedures: none Intrapartum Procedures: spontaneous vaginal delivery Postpartum Procedures: none Complications-Operative and Postpartum: none Hemoglobin  Date Value Ref Range Status  03/03/2014 10.6* 12.0 - 15.0 g/dL Final     HCT  Date Value Ref Range Status  03/03/2014 31.2* 36.0 - 46.0 % Final    Physical Exam:  General: alert Lochia: appropriate Uterine Fundus: firm Incision: healing well, no significant drainage, no dehiscence, no significant erythema DVT Evaluation: No evidence of DVT seen on physical exam.  Discharge Diagnoses: Term Pregnancy-delivered  Discharge Information: Date: 03/03/2014 Activity: pelvic rest Diet: routine Medications: Ibuprofen and pristiq Condition: improved Instructions: refer to practice specific booklet Discharge to: home   Newborn Data: Live born female  Birth Weight: 6 lb 11.1 oz (3035 g) APGAR: 8, 9  Home with mother.  Stacy Marquez L 03/03/2014, 8:32 AM

## 2014-03-03 NOTE — Progress Notes (Signed)

## 2014-03-03 NOTE — Anesthesia Postprocedure Evaluation (Signed)
  Anesthesia Post-op Note  Patient: Industrial/product designer  Procedure(s) Performed: * No procedures listed *  Patient Location: PACU and Mother/Baby  Anesthesia Type:Epidural  Level of Consciousness: awake, alert  and oriented  Airway and Oxygen Therapy: Patient Spontanous Breathing  Post-op Pain: mild  Post-op Assessment: Patient's Cardiovascular Status Stable, Respiratory Function Stable, No signs of Nausea or vomiting, Adequate PO intake, Pain level controlled and No headache  Post-op Vital Signs: Reviewed and stable  Last Vitals:  Filed Vitals:   03/03/14 0530  BP: 105/59  Pulse: 74  Temp: 36.6 C  Resp: 18    Complications: No apparent anesthesia complications

## 2014-03-06 ENCOUNTER — Inpatient Hospital Stay (HOSPITAL_COMMUNITY): Admission: RE | Admit: 2014-03-06 | Payer: 59 | Source: Ambulatory Visit

## 2014-05-01 ENCOUNTER — Other Ambulatory Visit: Payer: Self-pay | Admitting: Obstetrics and Gynecology

## 2014-05-02 LAB — CYTOLOGY - PAP

## 2014-05-07 ENCOUNTER — Encounter (HOSPITAL_COMMUNITY): Payer: Self-pay

## 2014-06-18 ENCOUNTER — Other Ambulatory Visit (INDEPENDENT_AMBULATORY_CARE_PROVIDER_SITE_OTHER): Payer: Self-pay

## 2014-06-18 DIAGNOSIS — E042 Nontoxic multinodular goiter: Secondary | ICD-10-CM

## 2014-06-20 ENCOUNTER — Other Ambulatory Visit: Payer: 59

## 2014-06-21 ENCOUNTER — Ambulatory Visit
Admission: RE | Admit: 2014-06-21 | Discharge: 2014-06-21 | Disposition: A | Discharge status: Home / Self Care | Payer: 59 | Source: Ambulatory Visit | Attending: Surgery | Admitting: Surgery

## 2014-06-21 DIAGNOSIS — E042 Nontoxic multinodular goiter: Secondary | ICD-10-CM

## 2014-07-12 ENCOUNTER — Other Ambulatory Visit (INDEPENDENT_AMBULATORY_CARE_PROVIDER_SITE_OTHER): Payer: Self-pay

## 2014-07-12 DIAGNOSIS — E041 Nontoxic single thyroid nodule: Secondary | ICD-10-CM

## 2014-07-13 ENCOUNTER — Other Ambulatory Visit: Payer: Self-pay | Admitting: Surgery

## 2014-07-13 DIAGNOSIS — E041 Nontoxic single thyroid nodule: Secondary | ICD-10-CM

## 2014-07-18 ENCOUNTER — Other Ambulatory Visit: Payer: Self-pay | Admitting: Surgery

## 2014-07-18 DIAGNOSIS — E041 Nontoxic single thyroid nodule: Secondary | ICD-10-CM

## 2014-07-19 ENCOUNTER — Other Ambulatory Visit (HOSPITAL_COMMUNITY)
Admission: RE | Admit: 2014-07-19 | Discharge: 2014-07-19 | Disposition: A | Discharge status: Home / Self Care | Payer: 59 | Source: Ambulatory Visit | Attending: Interventional Radiology | Admitting: Interventional Radiology

## 2014-07-19 ENCOUNTER — Ambulatory Visit
Admission: RE | Admit: 2014-07-19 | Discharge: 2014-07-19 | Disposition: A | Discharge status: Home / Self Care | Payer: 59 | Source: Ambulatory Visit | Attending: Surgery | Admitting: Surgery

## 2014-07-19 DIAGNOSIS — E041 Nontoxic single thyroid nodule: Secondary | ICD-10-CM | POA: Diagnosis present

## 2014-07-23 ENCOUNTER — Ambulatory Visit (INDEPENDENT_AMBULATORY_CARE_PROVIDER_SITE_OTHER): Payer: Self-pay | Admitting: Surgery

## 2014-07-24 NOTE — Progress Notes (Signed)
Quick Note:  Result of biopsy called to patient and discussed options for management - thyroid lobectomy versus observation. Patient wishes to proceed with surgical resection.  The risks and benefits of the procedure have been discussed at length with the patient. The patient understands the proposed procedure, potential alternative treatments, and the course of recovery to be expected. All of the patient's questions have been answered at this time. The patient wishes to proceed with surgery.  Earnstine Regal, MD, [facility] Office: [phone]  ______

## 2014-08-21 ENCOUNTER — Inpatient Hospital Stay (HOSPITAL_COMMUNITY)
Admission: RE | Admit: 2014-08-21 | Discharge: 2014-08-21 | Disposition: A | Discharge status: Home / Self Care | Payer: 59 | Source: Ambulatory Visit

## 2014-08-24 ENCOUNTER — Encounter (HOSPITAL_COMMUNITY): Payer: Self-pay

## 2014-08-24 ENCOUNTER — Encounter (HOSPITAL_COMMUNITY)
Admission: RE | Admit: 2014-08-24 | Discharge: 2014-08-24 | Disposition: A | Discharge status: Home / Self Care | Payer: 59 | Source: Ambulatory Visit | Attending: Surgery | Admitting: Surgery

## 2014-08-24 ENCOUNTER — Ambulatory Visit (HOSPITAL_COMMUNITY)
Admission: RE | Admit: 2014-08-24 | Discharge: 2014-08-24 | Disposition: A | Discharge status: Home / Self Care | Payer: 59 | Source: Ambulatory Visit | Attending: Surgery | Admitting: Surgery

## 2014-08-24 DIAGNOSIS — F1099 Alcohol use, unspecified with unspecified alcohol-induced disorder: Secondary | ICD-10-CM | POA: Diagnosis not present

## 2014-08-24 DIAGNOSIS — F419 Anxiety disorder, unspecified: Secondary | ICD-10-CM | POA: Diagnosis not present

## 2014-08-24 DIAGNOSIS — D440 Neoplasm of uncertain behavior of thyroid gland: Secondary | ICD-10-CM | POA: Diagnosis not present

## 2014-08-24 DIAGNOSIS — Z01818 Encounter for other preprocedural examination: Secondary | ICD-10-CM

## 2014-08-24 DIAGNOSIS — F1590 Other stimulant use, unspecified, uncomplicated: Secondary | ICD-10-CM | POA: Diagnosis not present

## 2014-08-24 LAB — BASIC METABOLIC PANEL
Anion gap: 9 (ref 5–15)
BUN: 15 mg/dL (ref 6–23)
CALCIUM: 9.4 mg/dL (ref 8.4–10.5)
CO2: 27 mmol/L (ref 19–32)
Chloride: 103 mmol/L (ref 96–112)
Creatinine, Ser: 0.75 mg/dL (ref 0.50–1.10)
GFR calc Af Amer: >90 mL/min (ref >90)
Glucose, Bld: 77 mg/dL (ref 70–99)
Potassium: 3.9 mmol/L (ref 3.5–5.1)
Sodium: 139 mmol/L (ref 135–145)

## 2014-08-24 LAB — HCG, SERUM, QUALITATIVE: PREG SERUM: NEGATIVE

## 2014-08-24 LAB — CBC
HEMATOCRIT: 41.8 % (ref 36.0–46.0)
Hemoglobin: 14.5 g/dL (ref 12.0–15.0)
MCH: 30.5 pg (ref 26.0–34.0)
MCHC: 34.7 g/dL (ref 30.0–36.0)
MCV: 88 fL (ref 78.0–100.0)
PLATELETS: 222 10*3/uL (ref 150–400)
RBC: 4.75 MIL/uL (ref 3.87–5.11)
RDW: 12 % (ref 11.5–15.5)
WBC: 4.3 10*3/uL (ref 4.0–10.5)

## 2014-08-24 NOTE — Progress Notes (Signed)
Quick Note:  These results are acceptable for scheduled surgery.  Emiley Digiacomo M. Mildreth Reek, MD, [facility] Office: [phone]   ______ 

## 2014-08-24 NOTE — Progress Notes (Signed)
Quick Note:  Pre-operative chest x-ray is acceptable for scheduled surgery.  Amiya Escamilla M. Kela Baccari, MD, [facility] Office: [phone]   ______ 

## 2014-08-24 NOTE — Pre-Procedure Instructions (Signed)
Stacy Marquez  08/24/2014   Your procedure is scheduled on: 08/28/2014  Report to [facility] Admitting at 8 AM.  Call this number if you have problems the morning of surgery: [phone]   Remember:   Do not eat food or drink liquids after midnight.   Take these medicines the morning of surgery with A SIP OF WATER: desvenlafaxine (PRISTIQ)   Do not wear jewelry, make-up or nail polish.  Do not wear lotions, powders, or perfumes. You may wear deodorant.  Do not shave 48 hours prior to surgery. Men may shave face and neck.  Do not bring valuables to the hospital.  [facility] is not responsible                  for any belongings or valuables.               Contacts, dentures or bridgework may not be worn into surgery.  Leave suitcase in the car. After surgery it may be brought to your room.  For patients admitted to the hospital, discharge time is determined by your                treatment team.               Patients discharged the day of surgery will not be allowed to drive  home.  Name and phone number of your driver: family/friend :    Please read over the following fact sheets that you were given: Pain Booklet, Coughing and Deep Breathing and Surgical Site Infection Prevention

## 2014-08-27 MED ORDER — CEFAZOLIN SODIUM-DEXTROSE 2-3 GM-% IV SOLR
2.0000 g | INTRAVENOUS | Status: AC
  Administered 2014-08-28: 2 g via INTRAVENOUS
  Filled 2014-08-27: qty 50

## 2014-08-28 ENCOUNTER — Observation Stay (HOSPITAL_COMMUNITY)
Admission: RE | Admit: 2014-08-28 | Discharge: 2014-08-29 | Disposition: A | Discharge status: Home / Self Care | Payer: 59 | Source: Ambulatory Visit | Attending: Surgery | Admitting: Surgery

## 2014-08-28 ENCOUNTER — Ambulatory Visit (HOSPITAL_COMMUNITY): Payer: 59 | Admitting: Anesthesiology

## 2014-08-28 ENCOUNTER — Encounter (HOSPITAL_COMMUNITY)
Admission: RE | Disposition: A | Discharge status: Home / Self Care | Payer: Self-pay | Source: Ambulatory Visit | Attending: Surgery

## 2014-08-28 ENCOUNTER — Encounter (HOSPITAL_COMMUNITY): Payer: Self-pay | Admitting: *Deleted

## 2014-08-28 DIAGNOSIS — F1590 Other stimulant use, unspecified, uncomplicated: Secondary | ICD-10-CM | POA: Insufficient documentation

## 2014-08-28 DIAGNOSIS — F1099 Alcohol use, unspecified with unspecified alcohol-induced disorder: Secondary | ICD-10-CM | POA: Insufficient documentation

## 2014-08-28 DIAGNOSIS — D440 Neoplasm of uncertain behavior of thyroid gland: Secondary | ICD-10-CM | POA: Diagnosis not present

## 2014-08-28 DIAGNOSIS — F419 Anxiety disorder, unspecified: Secondary | ICD-10-CM | POA: Insufficient documentation

## 2014-08-28 HISTORY — PX: THYROID LOBECTOMY: SHX420

## 2014-08-28 SURGERY — LOBECTOMY, THYROID
Anesthesia: General | Site: Neck | Laterality: Right

## 2014-08-28 MED ORDER — HEMOSTATIC AGENTS (NO CHARGE) OPTIME
TOPICAL | Status: DC | PRN
  Administered 2014-08-28: 1 via TOPICAL

## 2014-08-28 MED ORDER — PROPOFOL 10 MG/ML IV BOLUS
INTRAVENOUS | Status: DC | PRN
  Administered 2014-08-28: 110 mg via INTRAVENOUS

## 2014-08-28 MED ORDER — ONDANSETRON HCL 4 MG/2ML IJ SOLN: 4.0000 mg | Freq: Four times a day (QID) | INTRAMUSCULAR | Status: DC | PRN

## 2014-08-28 MED ORDER — DEXAMETHASONE SODIUM PHOSPHATE 4 MG/ML IJ SOLN
INTRAMUSCULAR | Status: DC | PRN
  Administered 2014-08-28: 8 mg via INTRAVENOUS

## 2014-08-28 MED ORDER — DIPHENHYDRAMINE HCL 50 MG/ML IJ SOLN
INTRAMUSCULAR | Status: AC
  Filled 2014-08-28: qty 1

## 2014-08-28 MED ORDER — DEXAMETHASONE SODIUM PHOSPHATE 4 MG/ML IJ SOLN
INTRAMUSCULAR | Status: AC
  Filled 2014-08-28: qty 2

## 2014-08-28 MED ORDER — FENTANYL CITRATE 0.05 MG/ML IJ SOLN
INTRAMUSCULAR | Status: AC
  Filled 2014-08-28: qty 5

## 2014-08-28 MED ORDER — DIPHENHYDRAMINE HCL 50 MG/ML IJ SOLN
10.0000 mg | Freq: Once | INTRAMUSCULAR | Status: AC
  Administered 2014-08-28: 6.25 mg via INTRAVENOUS

## 2014-08-28 MED ORDER — HYDROMORPHONE HCL 1 MG/ML IJ SOLN
INTRAMUSCULAR | Status: AC
  Filled 2014-08-28: qty 1

## 2014-08-28 MED ORDER — ACETAMINOPHEN 325 MG PO TABS: 650.0000 mg | ORAL_TABLET | ORAL | Status: DC | PRN

## 2014-08-28 MED ORDER — PROMETHAZINE HCL 25 MG/ML IJ SOLN: 6.2500 mg | INTRAMUSCULAR | Status: DC | PRN

## 2014-08-28 MED ORDER — ONDANSETRON HCL 4 MG/2ML IJ SOLN
INTRAMUSCULAR | Status: DC | PRN
  Administered 2014-08-28: 4 mg via INTRAVENOUS

## 2014-08-28 MED ORDER — HYDROMORPHONE HCL 1 MG/ML IJ SOLN
1.0000 mg | INTRAMUSCULAR | Status: DC | PRN
  Administered 2014-08-28 – 2014-08-29 (×3): 1 mg via INTRAVENOUS
  Filled 2014-08-28 (×3): qty 1

## 2014-08-28 MED ORDER — LIDOCAINE HCL (CARDIAC) 20 MG/ML IV SOLN
INTRAVENOUS | Status: DC | PRN
  Administered 2014-08-28: 20 mg via INTRAVENOUS

## 2014-08-28 MED ORDER — LACTATED RINGERS IV SOLN
INTRAVENOUS | Status: DC
  Administered 2014-08-28: 09:00:00 via INTRAVENOUS

## 2014-08-28 MED ORDER — MIDAZOLAM HCL 2 MG/2ML IJ SOLN: 0.5000 mg | Freq: Once | INTRAMUSCULAR | Status: DC | PRN

## 2014-08-28 MED ORDER — LACTATED RINGERS IV SOLN
INTRAVENOUS | Status: DC | PRN
  Administered 2014-08-28 (×2): via INTRAVENOUS

## 2014-08-28 MED ORDER — MEPERIDINE HCL 25 MG/ML IJ SOLN: 6.2500 mg | INTRAMUSCULAR | Status: DC | PRN

## 2014-08-28 MED ORDER — SCOPOLAMINE 1 MG/3DAYS TD PT72: 1.0000 | MEDICATED_PATCH | Freq: Once | TRANSDERMAL | Status: DC

## 2014-08-28 MED ORDER — SUCCINYLCHOLINE CHLORIDE 20 MG/ML IJ SOLN
INTRAMUSCULAR | Status: AC
  Filled 2014-08-28: qty 1

## 2014-08-28 MED ORDER — LIDOCAINE HCL (CARDIAC) 20 MG/ML IV SOLN
INTRAVENOUS | Status: AC
  Filled 2014-08-28: qty 5

## 2014-08-28 MED ORDER — GLYCOPYRROLATE 0.2 MG/ML IJ SOLN
INTRAMUSCULAR | Status: DC | PRN
  Administered 2014-08-28: 0.4 mg via INTRAVENOUS

## 2014-08-28 MED ORDER — NEOSTIGMINE METHYLSULFATE 10 MG/10ML IV SOLN
INTRAVENOUS | Status: DC | PRN
  Administered 2014-08-28: 3 mg via INTRAVENOUS

## 2014-08-28 MED ORDER — MIDAZOLAM HCL 2 MG/2ML IJ SOLN
INTRAMUSCULAR | Status: AC
  Filled 2014-08-28: qty 2

## 2014-08-28 MED ORDER — PROPOFOL 10 MG/ML IV BOLUS
INTRAVENOUS | Status: AC
  Filled 2014-08-28: qty 20

## 2014-08-28 MED ORDER — ROCURONIUM BROMIDE 100 MG/10ML IV SOLN
INTRAVENOUS | Status: DC | PRN
  Administered 2014-08-28: 40 mg via INTRAVENOUS

## 2014-08-28 MED ORDER — KCL IN DEXTROSE-NACL 20-5-0.45 MEQ/L-%-% IV SOLN
INTRAVENOUS | Status: DC
  Administered 2014-08-28: 15:00:00 via INTRAVENOUS
  Filled 2014-08-28 (×2): qty 1000

## 2014-08-28 MED ORDER — 0.9 % SODIUM CHLORIDE (POUR BTL) OPTIME
TOPICAL | Status: DC | PRN
  Administered 2014-08-28: 1000 mL

## 2014-08-28 MED ORDER — ONDANSETRON HCL 4 MG PO TABS: 4.0000 mg | ORAL_TABLET | Freq: Four times a day (QID) | ORAL | Status: DC | PRN

## 2014-08-28 MED ORDER — ONDANSETRON HCL 4 MG/2ML IJ SOLN
INTRAMUSCULAR | Status: AC
  Filled 2014-08-28: qty 2

## 2014-08-28 MED ORDER — ROCURONIUM BROMIDE 50 MG/5ML IV SOLN
INTRAVENOUS | Status: AC
  Filled 2014-08-28: qty 1

## 2014-08-28 MED ORDER — HYDROMORPHONE HCL 1 MG/ML IJ SOLN
0.2500 mg | INTRAMUSCULAR | Status: DC | PRN
  Administered 2014-08-28 (×3): 0.5 mg via INTRAVENOUS

## 2014-08-28 MED ORDER — HYDROCODONE-ACETAMINOPHEN 5-325 MG PO TABS
1.0000 | ORAL_TABLET | ORAL | Status: DC | PRN
  Administered 2014-08-29: 2 via ORAL
  Filled 2014-08-28: qty 2

## 2014-08-28 MED ORDER — NEOSTIGMINE METHYLSULFATE 10 MG/10ML IV SOLN
INTRAVENOUS | Status: AC
  Filled 2014-08-28: qty 1

## 2014-08-28 MED ORDER — GLYCOPYRROLATE 0.2 MG/ML IJ SOLN
INTRAMUSCULAR | Status: AC
  Filled 2014-08-28: qty 2

## 2014-08-28 MED ORDER — MIDAZOLAM HCL 5 MG/5ML IJ SOLN
INTRAMUSCULAR | Status: DC | PRN
  Administered 2014-08-28: 2 mg via INTRAVENOUS

## 2014-08-28 MED ORDER — FENTANYL CITRATE 0.05 MG/ML IJ SOLN
INTRAMUSCULAR | Status: DC | PRN
  Administered 2014-08-28: 50 ug via INTRAVENOUS
  Administered 2014-08-28: 100 ug via INTRAVENOUS
  Administered 2014-08-28 (×2): 50 ug via INTRAVENOUS

## 2014-08-28 MED ORDER — SCOPOLAMINE 1 MG/3DAYS TD PT72
MEDICATED_PATCH | TRANSDERMAL | Status: AC
  Filled 2014-08-28: qty 1

## 2014-08-28 MED ORDER — SCOPOLAMINE 1 MG/3DAYS TD PT72
MEDICATED_PATCH | TRANSDERMAL | Status: DC | PRN
  Administered 2014-08-28: 1 via TRANSDERMAL

## 2014-08-28 SURGICAL SUPPLY — 53 items
ATTRACTOMAT 16X20 MAGNETIC DRP (DRAPES) ×3 IMPLANT
BENZOIN TINCTURE PRP APPL 2/3 (GAUZE/BANDAGES/DRESSINGS) ×3 IMPLANT
BLADE SURG 10 STRL SS (BLADE) ×3 IMPLANT
BLADE SURG 15 STRL LF DISP TIS (BLADE) ×1 IMPLANT
BLADE SURG 15 STRL SS (BLADE) ×2
BLADE SURG ROTATE 9660 (MISCELLANEOUS) IMPLANT
CANISTER SUCTION 2500CC (MISCELLANEOUS) ×3 IMPLANT
CHLORAPREP W/TINT 10.5 ML (MISCELLANEOUS) ×3 IMPLANT
CLIP TI MEDIUM 24 (CLIP) ×3 IMPLANT
CLIP TI WIDE RED SMALL 24 (CLIP) ×3 IMPLANT
CLOSURE WOUND 1/2 X4 (GAUZE/BANDAGES/DRESSINGS) ×1
CONT SPEC 4OZ CLIKSEAL STRL BL (MISCELLANEOUS) IMPLANT
COVER SURGICAL LIGHT HANDLE (MISCELLANEOUS) ×3 IMPLANT
CRADLE DONUT ADULT HEAD (MISCELLANEOUS) ×3 IMPLANT
DRAPE PED LAPAROTOMY (DRAPES) ×3 IMPLANT
DRAPE UTILITY XL STRL (DRAPES) ×3 IMPLANT
ELECT CAUTERY BLADE 6.4 (BLADE) ×3 IMPLANT
ELECT REM PT RETURN 9FT ADLT (ELECTROSURGICAL) ×3
ELECTRODE REM PT RTRN 9FT ADLT (ELECTROSURGICAL) ×1 IMPLANT
GAUZE SPONGE 4X4 12PLY STRL (GAUZE/BANDAGES/DRESSINGS) ×3 IMPLANT
GAUZE SPONGE 4X4 16PLY XRAY LF (GAUZE/BANDAGES/DRESSINGS) ×3 IMPLANT
GLOVE BIO SURGEON STRL SZ7.5 (GLOVE) ×3 IMPLANT
GLOVE BIOGEL PI IND STRL 7.0 (GLOVE) ×1 IMPLANT
GLOVE BIOGEL PI IND STRL 7.5 (GLOVE) ×1 IMPLANT
GLOVE BIOGEL PI INDICATOR 7.0 (GLOVE) ×2
GLOVE BIOGEL PI INDICATOR 7.5 (GLOVE) ×2
GLOVE SURG ORTHO 8.0 STRL STRW (GLOVE) ×3 IMPLANT
GLOVE SURG SS PI 7.0 STRL IVOR (GLOVE) ×3 IMPLANT
GOWN STRL REUS W/ TWL LRG LVL3 (GOWN DISPOSABLE) ×2 IMPLANT
GOWN STRL REUS W/ TWL XL LVL3 (GOWN DISPOSABLE) ×1 IMPLANT
GOWN STRL REUS W/TWL LRG LVL3 (GOWN DISPOSABLE) ×4
GOWN STRL REUS W/TWL XL LVL3 (GOWN DISPOSABLE) ×2
HEMOSTAT SURGICEL 2X4 FIBR (HEMOSTASIS) ×3 IMPLANT
KIT BASIN OR (CUSTOM PROCEDURE TRAY) ×3 IMPLANT
KIT ROOM TURNOVER OR (KITS) ×3 IMPLANT
LIQUID BAND (GAUZE/BANDAGES/DRESSINGS) ×3 IMPLANT
NS IRRIG 1000ML POUR BTL (IV SOLUTION) ×3 IMPLANT
PACK SURGICAL SETUP 50X90 (CUSTOM PROCEDURE TRAY) ×3 IMPLANT
PAD ARMBOARD 7.5X6 YLW CONV (MISCELLANEOUS) ×3 IMPLANT
PENCIL BUTTON HOLSTER BLD 10FT (ELECTRODE) ×3 IMPLANT
SHEARS HARMONIC 9CM CVD (BLADE) ×3 IMPLANT
SPECIMEN JAR MEDIUM (MISCELLANEOUS) IMPLANT
SPONGE INTESTINAL PEANUT (DISPOSABLE) ×3 IMPLANT
STRIP CLOSURE SKIN 1/2X4 (GAUZE/BANDAGES/DRESSINGS) ×2 IMPLANT
SUT MNCRL AB 4-0 PS2 18 (SUTURE) ×3 IMPLANT
SUT SILK 2 0 (SUTURE) ×2
SUT SILK 2-0 18XBRD TIE 12 (SUTURE) ×1 IMPLANT
SUT VIC AB 3-0 SH 18 (SUTURE) ×6 IMPLANT
SYR BULB 3OZ (MISCELLANEOUS) ×3 IMPLANT
TOWEL OR 17X24 6PK STRL BLUE (TOWEL DISPOSABLE) IMPLANT
TOWEL OR 17X26 10 PK STRL BLUE (TOWEL DISPOSABLE) ×3 IMPLANT
TUBE CONNECTING 12'X1/4 (SUCTIONS) ×1
TUBE CONNECTING 12X1/4 (SUCTIONS) ×2 IMPLANT

## 2014-08-28 NOTE — H&P (Signed)
General Surgery [facility]  [employer] DOB: [birth year] Married / Language: English / Race: White Female  History of Present Illness  The patient is a 29 year old female who presents with a thyroid nodule. Patient is referred by Dr. Everlene Farrier for evaluation of enlarging thyroid nodule. Patient was originally diagnosed with a right-sided thyroid nodule during her first pregnancy in 2011. In 2013 she underwent a thyroid ultrasound showing a 3.1 cm complex nodule in the right lobe. 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 During her next pregnancy, she noted enlargement of the right thyroid lobe. It is now 4 months postpartum and the nodule seems to have continued to enlarge. She denies any compressive symptoms. She has had no prior head or neck surgery. She has never been on thyroid medication. There is no family history of thyroid disease. There is no family history of other endocrinopathy. Patient has not had any further diagnostic studies. She reportedly had thyroid function test done at the office of her gynecologist which were normal. We will obtain a copy of those results.   Other Problems Anxiety Disorder  Diagnostic Studies History Colonoscopy never Mammogram never Pap Smear 1-5 years ago  Allergies  No Known Drug Allergies12/14/2015  Medication History  Pristiq (100MG  Tablet ER 24HR, Oral) Active.  Social History  Alcohol use Occasional alcohol use. Caffeine use Carbonated beverages, Coffee, Tea. No drug use Tobacco use Never smoker.  Family History  Cerebrovascular Accident Family Members In General. Diabetes Mellitus Family Members In General. Migraine Headache Mother.  Pregnancy / Birth History  Age at menarche 75 years. Gravida 3 Irregular periods Maternal age 20-30 Para 2  Review of Systems General Not Present- Appetite Loss, Chills, Fatigue, Fever, Night  Sweats, Weight Gain and Weight Loss. Skin Not Present- Change in Wart/Mole, Dryness, Hives, Jaundice, New Lesions, Non-Healing Wounds, Rash and Ulcer. HEENT Not Present- Earache, Hearing Loss, Hoarseness, Nose Bleed, Oral Ulcers, Ringing in the Ears, Seasonal Allergies, Sinus Pain, Sore Throat, Visual Disturbances, Wears glasses/contact lenses and Yellow Eyes. Respiratory Not Present- Bloody sputum, Chronic Cough, Difficulty Breathing, Snoring and Wheezing. Breast Not Present- Breast Mass, Breast Pain, Nipple Discharge and Skin Changes. Cardiovascular Not Present- Chest Pain, Difficulty Breathing Lying Down, Leg Cramps, Palpitations, Rapid Heart Rate, Shortness of Breath and Swelling of Extremities. Gastrointestinal Not Present- Abdominal Pain, Bloating, Bloody Stool, Change in Bowel Habits, Chronic diarrhea, Constipation, Difficulty Swallowing, Excessive gas, Gets full quickly at meals, Hemorrhoids, Indigestion, Nausea, Rectal Pain and Vomiting. Female Genitourinary Not Present- Frequency, Nocturia, Painful Urination, Pelvic Pain and Urgency. Musculoskeletal Not Present- Back Pain, Joint Pain, Joint Stiffness, Muscle Pain, Muscle Weakness and Swelling of Extremities. Neurological Not Present- Decreased Memory, Fainting, Headaches, Numbness, Seizures, Tingling, Tremor, Trouble walking and Weakness. Psychiatric Not Present- Anxiety, Bipolar, Change in Sleep Pattern, Depression, Fearful and Frequent crying. Endocrine Not Present- Cold Intolerance, Excessive Hunger, Hair Changes, Heat Intolerance, Hot flashes and New Diabetes. Hematology Not Present- Easy Bruising, Excessive bleeding, Gland problems, HIV and Persistent Infections.   Vitals  06/18/2014 10:42 AM Weight: 151 lb Height: 66in Body Surface Area: 1.79 m Body Mass Index: 24.37 kg/m Temp.: 77F(Temporal)  Pulse: 77 (Regular)  BP: 116/70 (Sitting, Left Arm, Standard)    Physical Exam   General - appears comfortable, no  distress; not diaphorectic  HEENT - normocephalic; sclerae clear, gaze conjugate; mucous membranes moist, dentition good; voice normal  Neck - asymmetric on extension; no palpable anterior or posterior cervical adenopathy; obvious large smooth soft  mass involving most of the right thyroid lobe, measuring approximately 4-5 cm in diameter, nontender; left thyroid lobe without palpable abnormality  Chest - clear bilaterally with rhonchi, rales, or wheeze  Cor - regular rhythm with normal rate; no significant murmur  Ext - non-tender without significant edema or lymphedema  Neuro - grossly intact; no tremor    Assessment & Plan  THYROID NODULE (241.0  E04.1)  The patient has a dominant nodule in the right thyroid lobe which appears to have clinically increased in size over the past 2 years. This is in association with her recent pregnancy. She has had no further imaging studies. Thyroid function test are reportedly normal.  We will obtain a thyroid ultrasound to compare to her study of June 2013.  We will obtain a copy of her laboratory studies, specifically her thyroid function test.  Patient underwent USN guided FNA biopsy of the right thyroid nodule demonstrating atypia.  She now comes to surgery for right thyroid lobectomy for definitive diagnosis.  The risks and benefits of the procedure have been discussed at length with the patient.  The patient understands the proposed procedure, potential alternative treatments, and the course of recovery to be expected.  All of the patient's questions have been answered at this time.  The patient wishes to proceed with surgery.  Earnstine Regal, MD, [facility] Office: [phone]

## 2014-08-28 NOTE — Anesthesia Preprocedure Evaluation (Addendum)
Anesthesia Evaluation  Patient identified by MRN, date of birth, ID band Patient awake    Reviewed: Allergy & Precautions, NPO status , Patient's Chart, lab work & pertinent test results  History of Anesthesia Complications Negative for: history of anesthetic complications  Airway Mallampati: I   Neck ROM: Full    Dental  (+) Teeth Intact, Dental Advisory Given   Pulmonary neg pulmonary ROS,  breath sounds clear to auscultation        Cardiovascular negative cardio ROS  Rhythm:Regular Rate:Normal     Neuro/Psych negative neurological ROS     GI/Hepatic negative GI ROS, Neg liver ROS,   Endo/Other  negative endocrine ROS  Renal/GU negative Renal ROS     Musculoskeletal   Abdominal   Peds  Hematology negative hematology ROS (+)   Anesthesia Other Findings   Reproductive/Obstetrics mirena 08/24/14 preg test NEG                            Anesthesia Physical Anesthesia Plan  ASA: I  Anesthesia Plan: General   Post-op Pain Management:    Induction: Intravenous  Airway Management Planned: Oral ETT  Additional Equipment:   Intra-op Plan:   Post-operative Plan: Extubation in OR  Informed Consent: I have reviewed the patients History and Physical, chart, labs and discussed the procedure including the risks, benefits and alternatives for the proposed anesthesia with the patient or authorized representative who has indicated his/her understanding and acceptance.   Dental advisory given  Plan Discussed with: CRNA and Surgeon  Anesthesia Plan Comments: (Plan routine monitors, GETA)        Anesthesia Quick Evaluation

## 2014-08-28 NOTE — Anesthesia Postprocedure Evaluation (Signed)
  Anesthesia Post-op Note  Patient: Soil scientist) Performed: Procedure(s): RIGHT THYROID LOBECTOMY (Right)  Patient Location: PACU  Anesthesia Type:General  Level of Consciousness: awake, alert , oriented and patient cooperative  Airway and Oxygen Therapy: Patient connected to nasal cannula oxygen  Post-op Pain: mild  Post-op Assessment: Post-op Vital signs reviewed, Patient's Cardiovascular Status Stable, Respiratory Function Stable, Patent Airway, No signs of Nausea or vomiting and Pain level controlled  Post-op Vital Signs: Reviewed and stable  Last Vitals:  Filed Vitals:   08/28/14 1432  BP: 128/82  Pulse: 65  Temp: 37 C  Resp:     Complications: No apparent anesthesia complications

## 2014-08-28 NOTE — Transfer of Care (Signed)
Immediate Anesthesia Transfer of Care Note  Patient: Industrial/product designer  Procedure(s) Performed: Procedure(s): RIGHT THYROID LOBECTOMY (Right)  Patient Location: PACU  Anesthesia Type:General  Level of Consciousness: awake, alert , oriented and patient cooperative  Airway & Oxygen Therapy: Patient Spontanous Breathing  Post-op Assessment: Report given to RN, Post -op Vital signs reviewed and stable and Patient moving all extremities  Post vital signs: Reviewed and stable  Last Vitals:  Filed Vitals:   08/28/14 0830  BP: 93/66  Pulse:   Temp:   Resp:     Complications: No apparent anesthesia complications

## 2014-08-28 NOTE — Brief Op Note (Signed)
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  PRE-OPERATIVE DIAGNOSIS:  THYROID NEOPLASM OF UNCERTAIN BEHAVIOR, RIGHT LOBE  POST-OPERATIVE DIAGNOSIS:  THYROID NEOPLASM OF UNCERTAIN BEHAVIOR, RIGHT LOBE  PROCEDURE:  Procedure(s): RIGHT THYROID LOBECTOMY (Right)  SURGEON:  Surgeon(s) and Role:    * Armandina Gemma, MD - Primary  ASSISTANT: Sharyn Dross, RNFA  EBL:  Total I/O In: 1000 [I.V.:1000] Out: 40 [Blood:40]  BLOOD ADMINISTERED:none  DRAINS: none   LOCAL MEDICATIONS USED:  NONE  SPECIMEN:  Excision  DISPOSITION OF SPECIMEN:  PATHOLOGY  COUNTS:  YES  TOURNIQUET:  * No tourniquets in log *  DICTATION: .Other Dictation: Dictation Number [id number]  PLAN OF CARE: Admit for overnight observation  PATIENT DISPOSITION:  PACU - hemodynamically stable.   Delay start of Pharmacological VTE agent (>24hrs) due to surgical blood loss or risk of bleeding: yes  Earnstine Regal, MD, [facility] Office: [phone]

## 2014-08-29 DIAGNOSIS — D440 Neoplasm of uncertain behavior of thyroid gland: Secondary | ICD-10-CM | POA: Diagnosis not present

## 2014-08-29 MED ORDER — OXYCODONE HCL 5 MG PO TABS: 5.0000 mg | ORAL_TABLET | Freq: Four times a day (QID) | ORAL | Status: AC | PRN

## 2014-08-29 NOTE — Discharge Summary (Signed)
Physician Discharge Summary [facility]  Patient ID: Niamya Vittitow MRN: [id number] DOB/AGE: 31/16/1987 31 y.o.  Admit date: 08/28/2014 Discharge date: 08/29/2014  Admission Diagnoses:  Thyroid neoplasm of uncertain behavior  Discharge Diagnoses:  Principal Problem:   Neoplasm of uncertain behavior of thyroid gland   Discharged Condition: good  Hospital Course: Patient was admitted for observation following thyroid surgery.  Post op course was uncomplicated.  Pain was well controlled.  Tolerated diet. Patient was prepared for discharge home on POD#1.  Consults: None  Treatments: surgery: right thyroid lobectomy  Discharge Exam: Blood pressure 99/52, pulse 80, temperature 98.3 F (36.8 C), temperature source Oral, resp. rate 15, height 5\' 6"  (1.676 m), weight 156 lb (70.761 kg), SpO2 98 %, not currently breastfeeding. HEENT - clear Neck - incision dry and intact; minimal STS; voice normal Chest - clear bilaterally Cor - RRR   Disposition: Home  Discharge Instructions    Diet - low sodium heart healthy    Complete by:  As directed      Discharge instructions    Complete by:  As directed   [facility]  THYROID & PARATHYROID SURGERY:  POST-OP INSTRUCTIONS  Always review your discharge instruction sheet from the facility where your surgery was performed.  A prescription for pain medication may be given to you upon discharge.  Take your pain medication as prescribed.  If narcotic pain medicine is not needed, then you may take acetaminophen (Tylenol) or ibuprofen (Advil) as needed.  Take your usually prescribed medications unless otherwise directed.  If you need a refill on your pain medication, please contact your pharmacy. They will contact our office to request authorization.  Prescriptions will not be processed after 5 pm or on weekends.  Start with a light diet upon arrival home, such as soup and crackers or toast.  Be sure to  drink plenty of fluids daily.  Resume your normal diet the day after surgery.  Most patients will experience some swelling and bruising on the chest and neck area.  Ice packs will help.  Swelling and bruising can take several days to resolve.   It is common to experience some constipation after surgery.  Increasing fluid intake and taking a stool softener will usually help or prevent this problem.  A mild laxative (Milk of Magnesia or Miralax) should be taken according to package directions if there has been no bowel movement after 48 hours.  If you have steri-strips and a gauze dressing over your incision, you may remove the gauze bandage on the second day after surgery, and you may shower at that time.  Leave your steri-strips (small skin tapes) in place directly over the incision.  These strips should remain on the skin for 7-10 days and then be removed.  You may get them wet in the shower and pat them dry.  If you have Dermabond (topical glue) over your incision, you may shower at any time following your procedure.  Leave the glue in place for 10-14 days.  When it begins to flake off around the edges, you may remove it.  You may resume regular (light) daily activities beginning the next day-such as daily self-care, walking, climbing stairs-gradually increasing activities as tolerated.  You may have sexual intercourse when it is comfortable.  Refrain from any heavy lifting or straining until approved by your doctor.  You may drive when you no longer are taking prescription pain medication, you can comfortably wear a seatbelt, and you  can safely maneuver your car and apply brakes.  You should see your doctor in the office for a follow-up appointment approximately two to three weeks after your surgery.  Make sure that you call for this appointment within a day or two after you arrive home to insure a convenient appointment time.  WHEN TO CALL YOUR DOCTOR: -- Fever greater than 101.5 -- Inability to  urinate -- Nausea and/or vomiting - persistent -- Extreme swelling or bruising -- Continued bleeding from incision -- Increased pain, redness, or drainage from the incision -- Difficulty swallowing or breathing -- Muscle cramping or spasms -- Numbness or tingling in hands or around lips  The clinic staff is available to answer your questions during regular business hours.  Please don't hesitate to call and ask to speak to one of the nurses if you have concerns.  Earnstine Regal, MD, [facility] Office: [phone]  Website: [email or web address]     Increase activity slowly    Complete by:  As directed      No dressing needed    Complete by:  As directed             Medication List    TAKE these medications        desvenlafaxine 100 MG 24 hr tablet  Commonly known as:  PRISTIQ  Take 100 mg by mouth daily.     desvenlafaxine 50 MG 24 hr tablet  Commonly known as:  PRISTIQ  Take 1 tablet (50 mg total) by mouth daily.     ibuprofen 600 MG tablet  Commonly known as:  ADVIL,MOTRIN  Take 1 tablet (600 mg total) by mouth every 6 (six) hours.     levonorgestrel 20 MCG/24HR IUD  Commonly known as:  MIRENA  1 each by Intrauterine route once. Implanted November 2015     oxyCODONE 5 MG immediate release tablet  Commonly known as:  Oxy IR/ROXICODONE  Take 1-2 tablets (5-10 mg total) by mouth every 6 (six) hours as needed for moderate pain.         Earnstine Regal, MD, [facility] Office: [phone]   Signed: Earnstine Regal 08/29/2014, 7:41 AM

## 2014-08-29 NOTE — Progress Notes (Signed)
Discussed discharge summary with patient. Reviewed all medications with patient. Patient received Rx. Patient ready for discharge. 

## 2014-08-29 NOTE — Plan of Care (Signed)
Problem: Phase I Progression Outcomes Goal: OOB as tolerated unless otherwise ordered Outcome: Completed/Met Date Met:  08/29/14 Patient went to bathroom by herself.

## 2014-08-29 NOTE — Op Note (Signed)
[name], [name]             ACCOUNT NO.:  [id number]  MEDICAL RECORD NO.:  [id number]  LOCATION:  6N05C                        FACILITY:  [facility]  PHYSICIAN:  Earnstine Regal, MD      DATE OF BIRTH:  [birth year]  DATE OF PROCEDURE:  08/28/2014                              OPERATIVE REPORT   PREOPERATIVE DIAGNOSIS:  Right thyroid neoplasm of uncertain behavior.  POSTOPERATIVE DIAGNOSIS:  Right thyroid neoplasm of uncertain behavior.  PROCEDURE:  Right thyroid lobectomy.  SURGEON:  Earnstine Regal, MD, FACS  ANESTHESIA:  General per Dr. Annye Asa.  ASSISTANT:  Stacy Marquez, RNFA  ESTIMATED BLOOD LOSS:  Minimal.  PREPARATION:  ChloraPrep.  COMPLICATIONS:  None.  INDICATIONS:  The patient is a 29 year old female referred by her gynecologist, Dr. Everlene Farrier, for evaluation of an enlarging thyroid nodule.  This had been originally diagnosed during her first pregnancy in 2011.  Initial fine-needle aspiration biopsy showed non-neoplastic goiter.  The patient had interval enlargement on sequential ultrasound scanning.  Repeat fine needle aspiration biopsy showed cytologic atypia with nuclear changes, Bethesda category III.  The patient now comes to Surgery for resection for definitive diagnosis.  BODY OF REPORT:  Procedure was done in OR #9 at the [place].  The patient was brought to the operating room, placed in supine position on the operating room table.  Following administration of general anesthesia, the patient was positioned and then prepped and draped in the usual aseptic fashion.  After ascertaining that an adequate level of anesthesia had been achieved, a Kocher incision was made with a #15 blade.  Dissection was carried through subcutaneous tissues and platysma.  Hemostasis was achieved with the electrocautery.  Skin flaps were elevated cephalad and caudad from the thyroid notch to the sternal notch.  The Mahorner  self-retaining retractor was placed for exposure.  Strap muscles were incised in the midline.  Strap muscles were elevated off the anterior portion of the left thyroid lobe.  Palpation shows the left thyroid lobe to be grossly normal without palpable nodularity.  There was no lymphadenopathy present.  Next, we turned our attention to the right side.  Right strap muscles were elevated and reflected laterally exposing then a markedly enlarged right thyroid lobe.  There appears to be a single dominant nodule occupying most of the right lobe.  Right lobe was gently mobilized with blunt dissection.  Venous tributaries were divided between Ligaclips with the Harmonic scalpel.  Superior pole vessels were divided individually between small and medium Ligaclips with the Harmonic scalpel.  Gland was rolled anteriorly and inferior venous tributaries divided between Ligaclips with the Harmonic scalpel.  Branches of the inferior thyroid artery were divided between small Ligaclips. Parathyroid tissue and recurrent laryngeal nerve were positively identified and preserved.  Dissection carried down to the ligament of Gwenlyn Found which is released with the electrocautery and the lobe was mobilized onto the anterior trachea.  There was a moderate-sized pyramidal lobe, which was dissected out and resected with the specimen. Isthmus was mobilized across the midline and then transected at the junction of the isthmus and left thyroid lobe with the Harmonic scalpel. The entire right thyroid lobe and  isthmus was submitted to Pathology for review.  The neck was irrigated with warm saline and good hemostasis was noted. Fibrillar was placed throughout the operative field.  Strap muscles were reapproximated in the midline with interrupted 3-0 Vicryl sutures. Platysma was reapproximated with interrupted 3-0 Vicryl sutures.  Skin was closed with a running 4-0 Monocryl subcuticular suture.  Wound was washed and dried and  Dermabond was applied as topical dressing.  The patient was awakened from anesthesia and brought to the recovery room. The patient tolerated the procedure well.   Earnstine Regal, MD, [facility] Office: [phone]   TMG/MEDQ  D:  08/28/2014  T:  08/29/2014  Job:  [id number]  cc:   Earnstine Regal, MD Daleen Bo Gaetano Net, M.D.

## 2014-08-30 ENCOUNTER — Encounter (HOSPITAL_COMMUNITY): Payer: Self-pay | Admitting: Surgery

## 2015-08-23 DIAGNOSIS — Z6822 Body mass index (BMI) 22.0-22.9, adult: Secondary | ICD-10-CM | POA: Diagnosis not present

## 2015-08-23 DIAGNOSIS — Z01419 Encounter for gynecological examination (general) (routine) without abnormal findings: Secondary | ICD-10-CM | POA: Diagnosis not present

## 2015-09-27 MED FILL — DESVENLAFAXINE SUC ER 100 M: 100 | 30 days supply | Qty: 30 | Fill #0 | Status: TO

## 2015-10-31 MED FILL — DESVENLAFAXINE SUC ER 100 M: 100 | 30 days supply | Qty: 30 | Fill #1 | Status: TO

## 2015-11-30 MED FILL — DESVENLAFAXINE SUC ER 100 M: 100 | 30 days supply | Qty: 30 | Fill #2 | Status: TO

## 2016-01-06 MED FILL — DESVENLAFAXINE SUC ER 100 M: 100 | 30 days supply | Qty: 30 | Fill #3 | Status: TO

## 2016-10-25 IMAGING — US US SOFT TISSUE HEAD/NECK
1 series · 14 of 25 positions shown · non-contrast
Comparison: 12/16/2011 and earlier studies

CLINICAL DATA: Thyroid nodules. Previous FNA biopsy of dominant
right lesion 12/16/2011.

EXAM:
THYROID ULTRASOUND
TECHNIQUE: Ultrasound examination of the thyroid gland and adjacent soft
tissues was performed.

[Series 1: us soft tissue head/neck · 0.07mm/px · 14 of 40 slices shown]
[im 1/40]
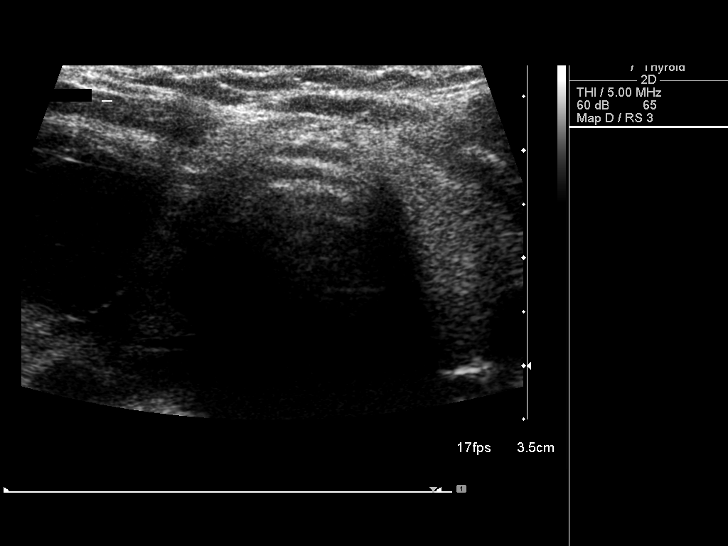
[im 4/40]
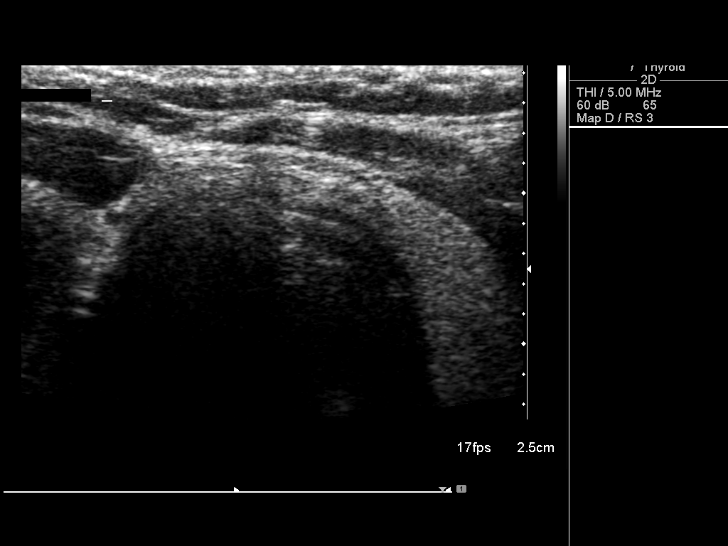
[im 7/40]
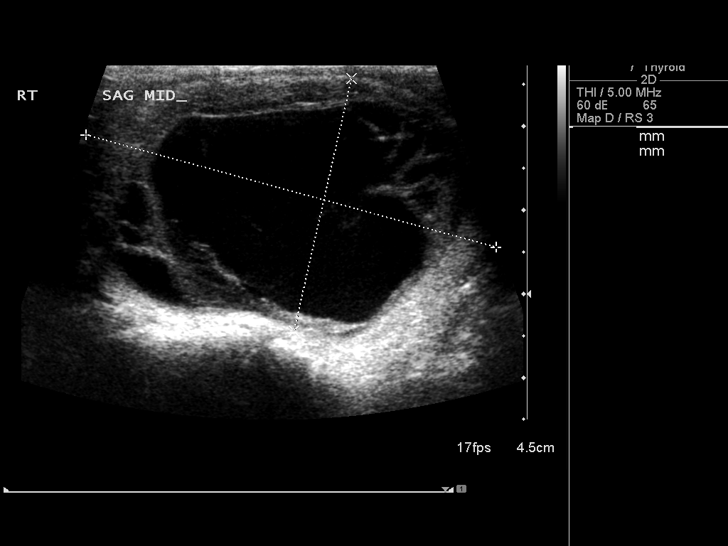
[im 10/40]
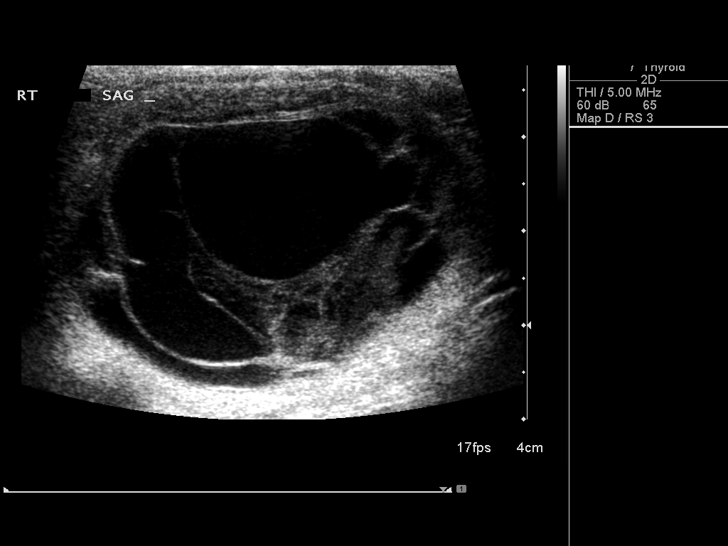
[im 14/40]
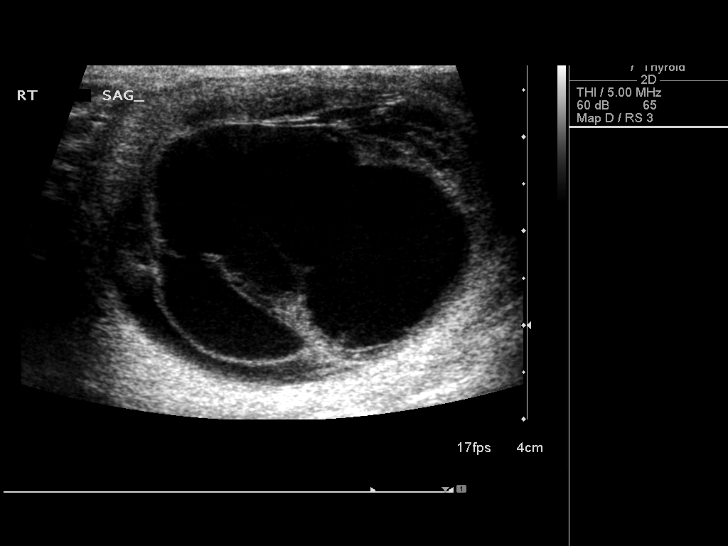
[im 15/40]
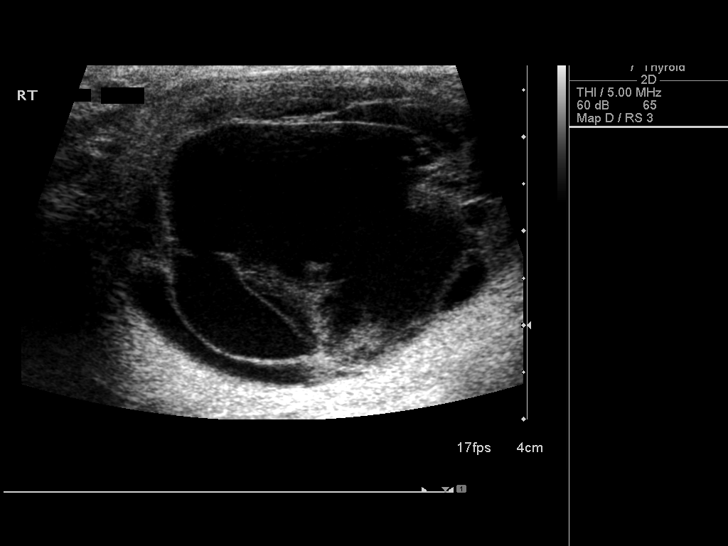
[im 18/40]
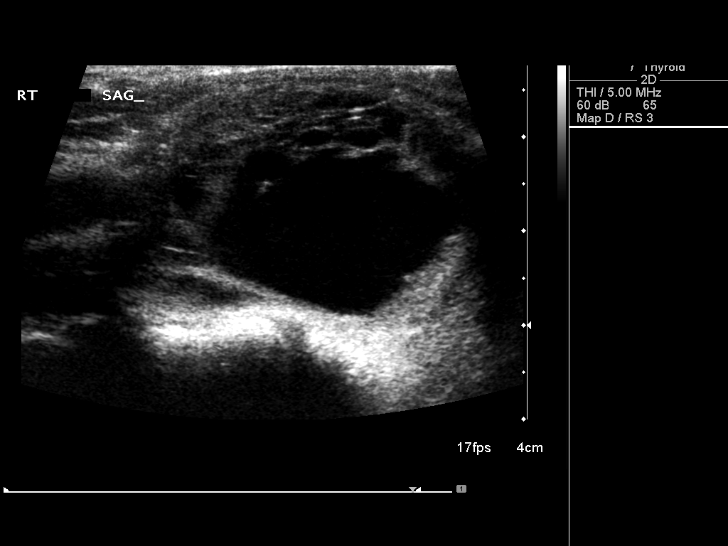
[im 22/40]
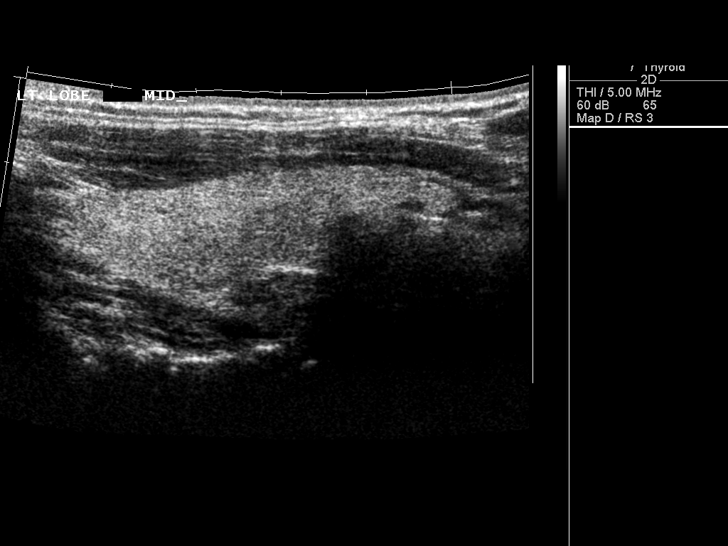
[im 25/40]
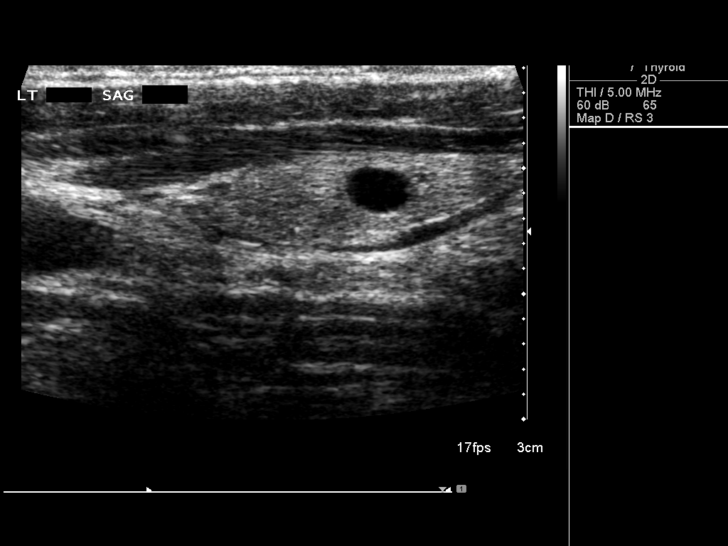
[im 27/40]
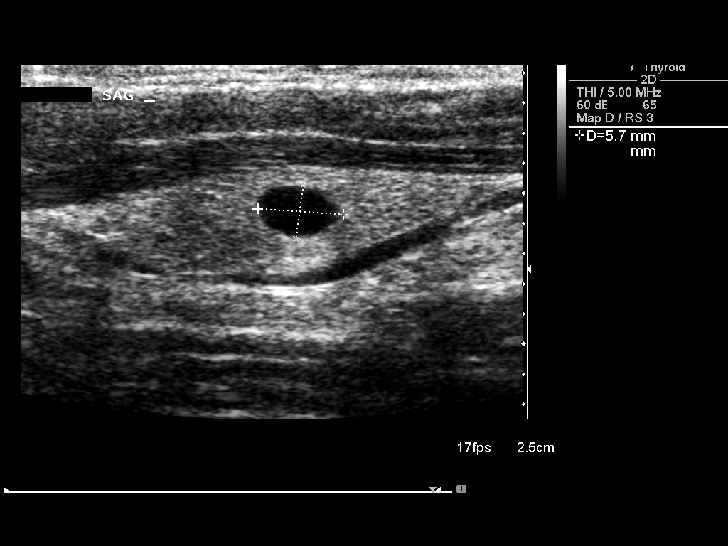
[im 30/40]
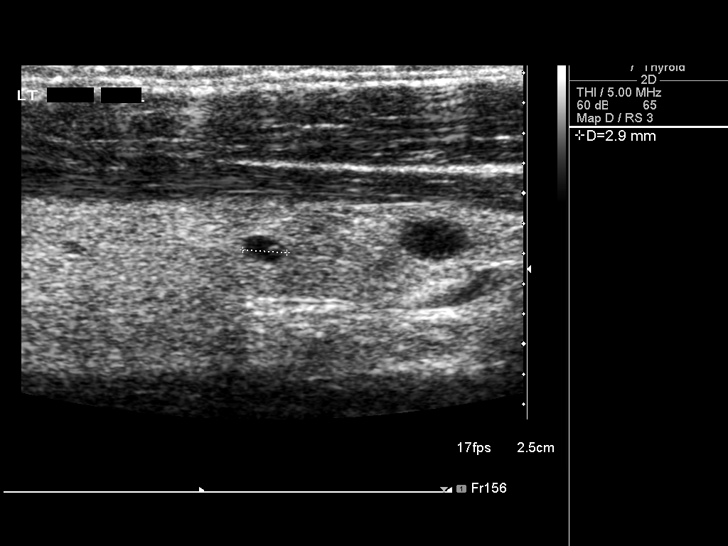
[im 33/40]
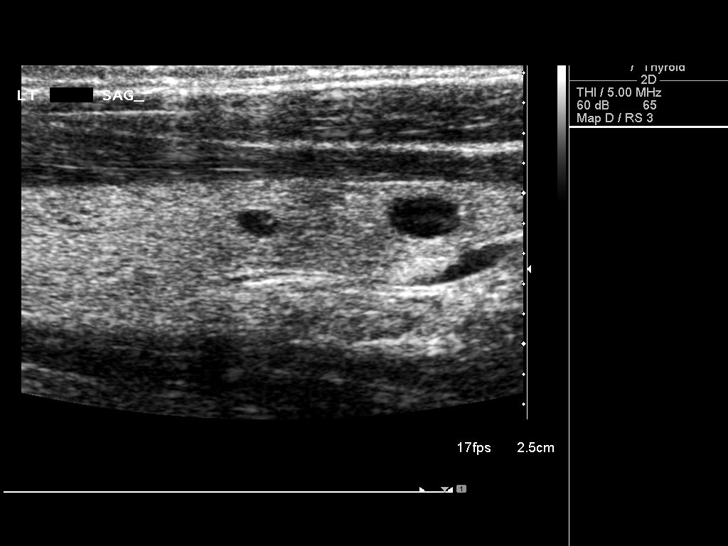
[im 36/40]
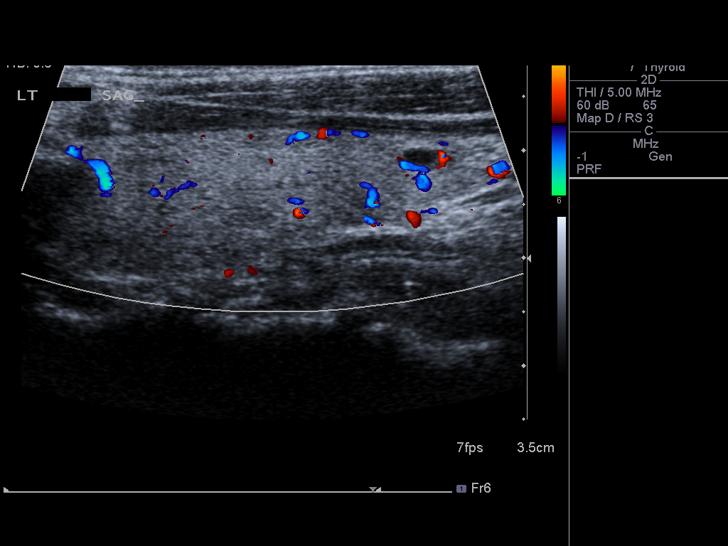
[im 40/40]
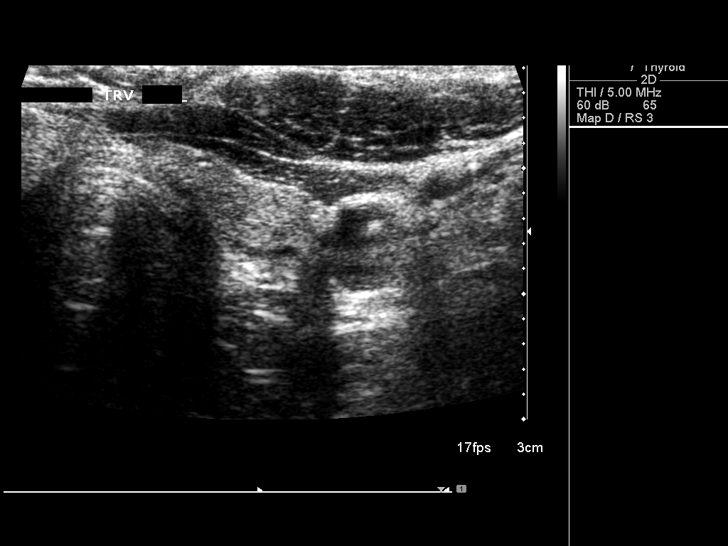

[14 of 25 positions shown; findings below may reference images not displayed]

FINDINGS: Right thyroid lobe

Measurements: 51 x 31 x 36 mm. There is a dominant 42 x 20 x 37 mm
complex mostly cystic lesion in the mid lobe (previously 31 x 16 x
9).

Left thyroid lobe

Measurements: 46 x 13 x 12 mm. 6 x 4 mm cyst, inferior pole. Several
smaller hypoechoic/cystic lesions all 3 mm or less.

Isthmus

Thickness: 3 mm.  No nodules visualized.

Lymphadenopathy

None visualized.
IMPRESSION: 1. Interval enlargement of dominant right cystic mass. Correlate
with previous biopsy results.

## 2016-11-22 IMAGING — US US THYROID BIOPSY
1 series · 13 of 15 positions shown · non-contrast
Comparison: none

CLINICAL DATA: 28-year-old female with a history of right thyroid
nodule. This has been previously biopsied, 12/16/2011. It has become
more cystic in the interval with larger diameter. She has been
referred for biopsy.

[Series 1: us thyroid biopsy · 0.09mm/px · 15 acquisitions, 13 frames shown]
[im 1/15]
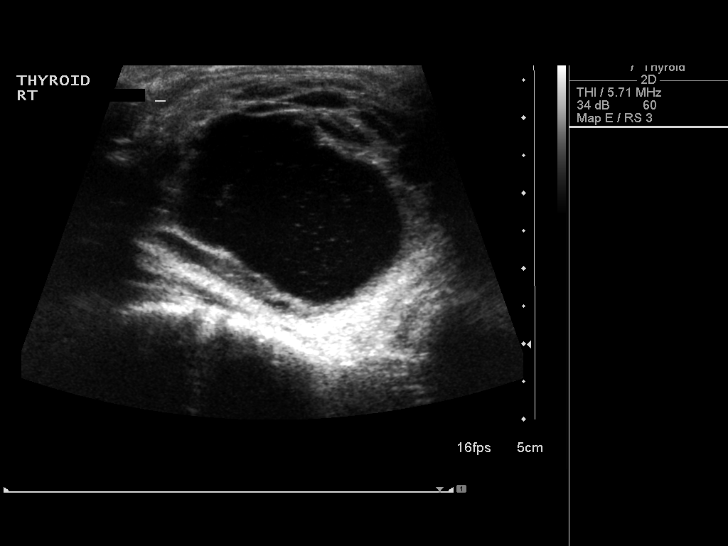
[im 2/15]
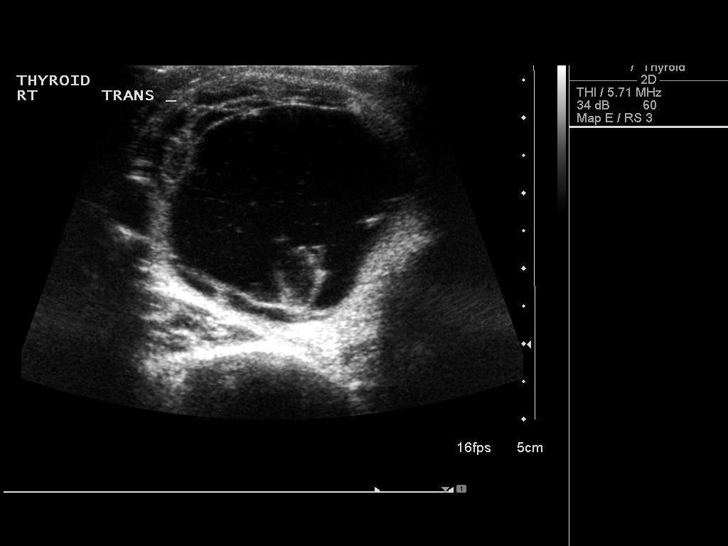
[im 3/15]
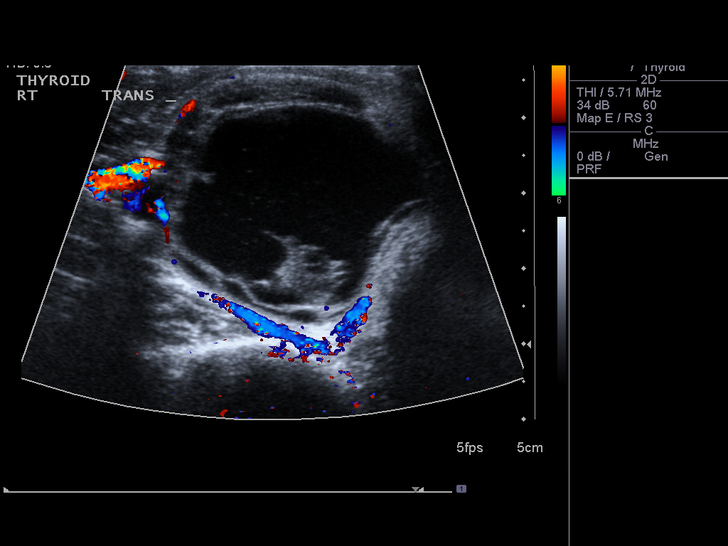
[im 5/15]
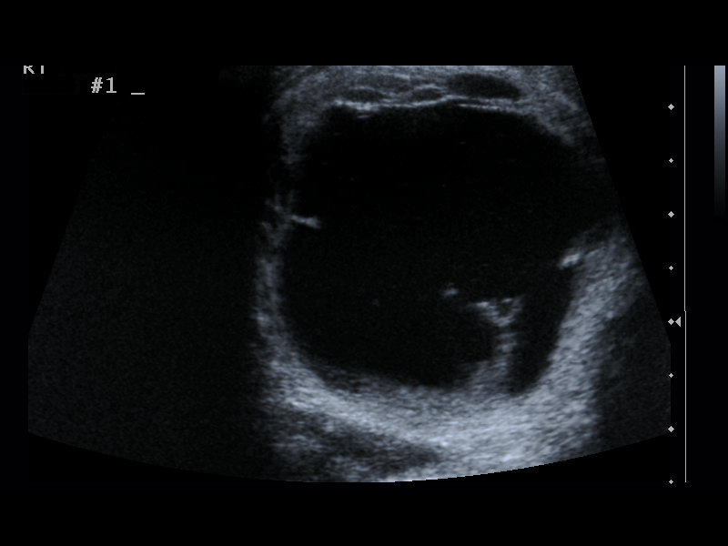
[im 6/15]
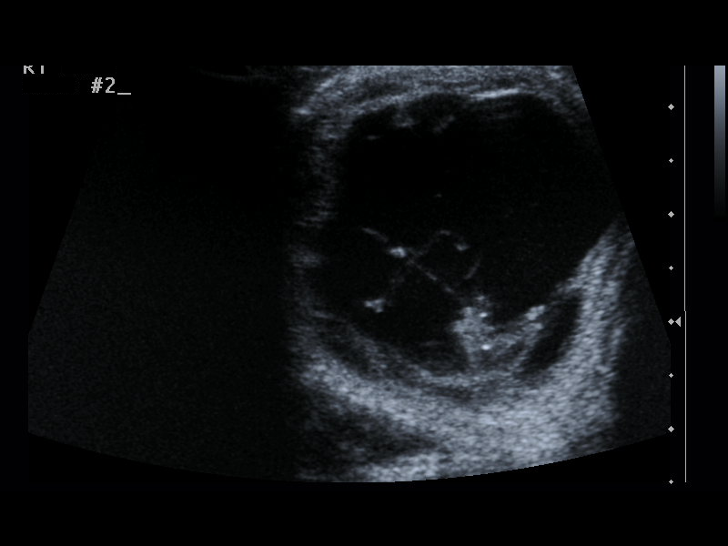
[im 7/15]
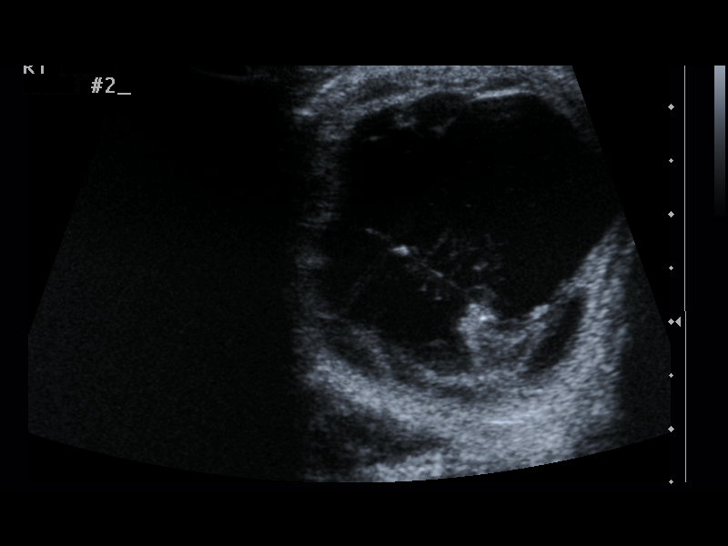
[im 8/15]
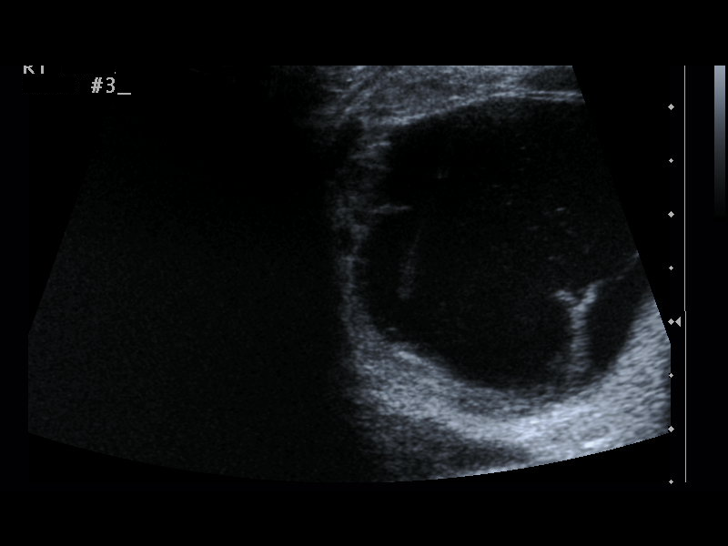
[im 9/15]
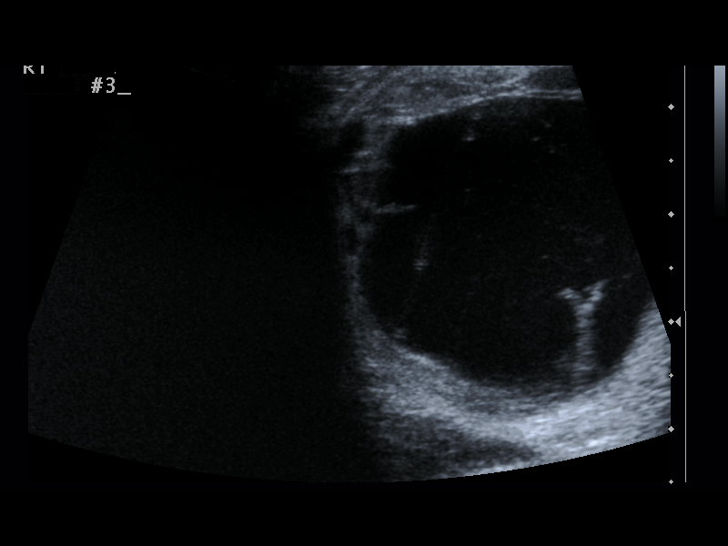
[im 10/15]
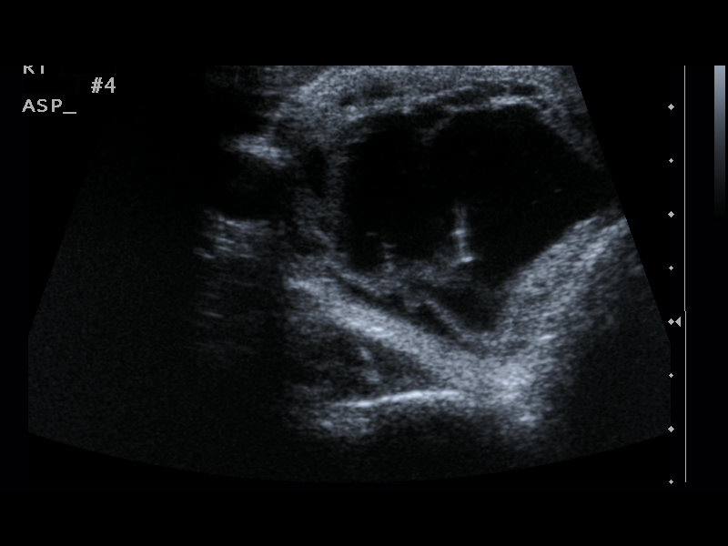
[im 11/15]
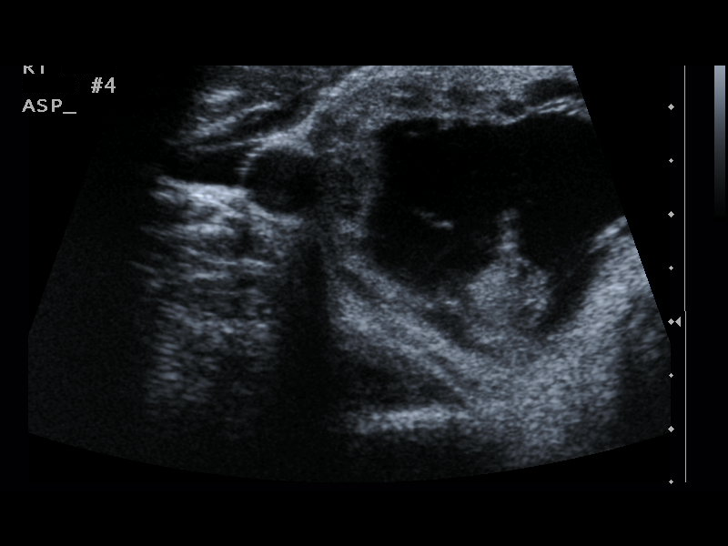
[im 13/15]
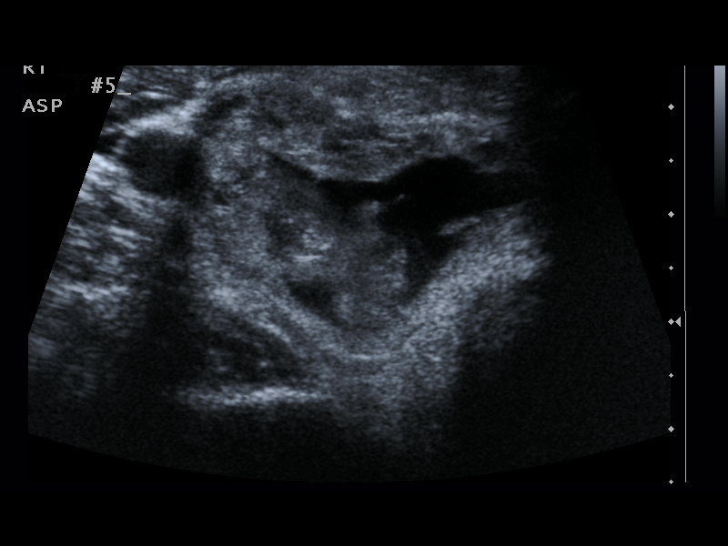
[im 14/15]
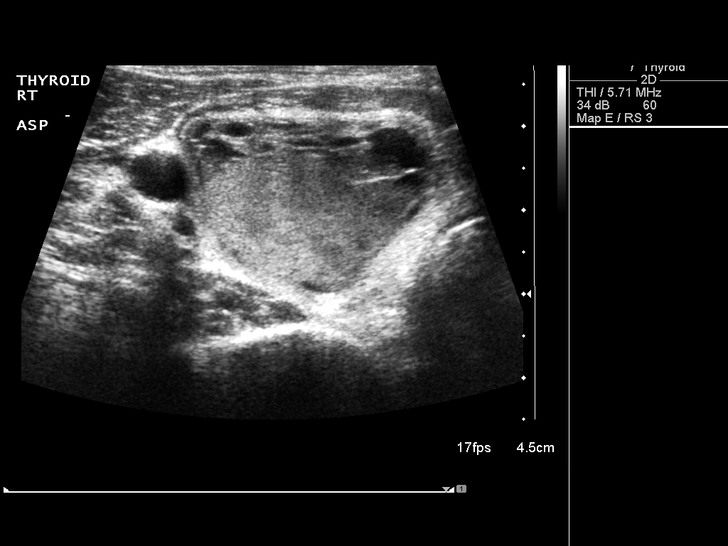
[im 15/15]
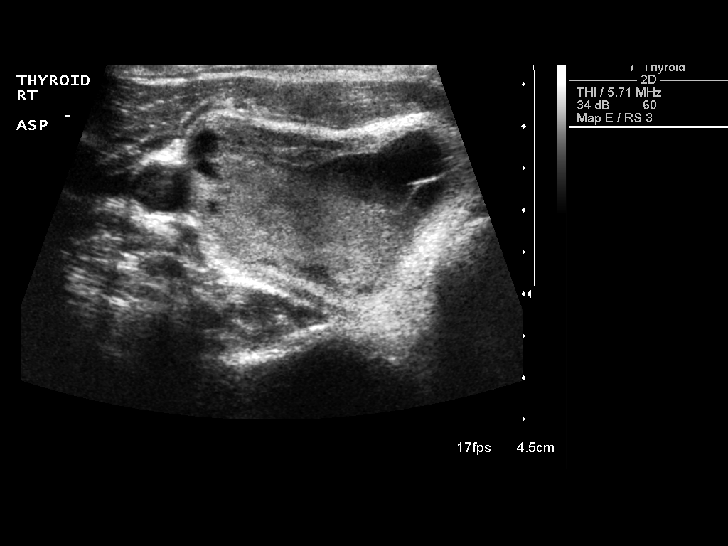

[13 of 15 positions shown; findings below may reference images not displayed]

EXAM:
ULTRASOUND GUIDED FINE NEEDLE BIOPSY OF RIGHT THYROID NODULE

MEDICATIONS:
None

PROCEDURE:
The procedure, risks, benefits, and alternatives were explained to
the patient. Questions regarding the procedure were encouraged and
answered. The patient understands and consents to the procedure.

Ultrasound survey was performed with images stored and sent to PACs.

The right neck was prepped with Betadine in a sterile fashion, and a
sterile drape was applied covering the operative field. A sterile
gown and sterile gloves were used for the procedure. Local
anesthesia was provided with 1% Lidocaine.

Ultrasound guidance was used to infiltrate the region with 1%
lidocaine for local anesthesia. Four separate 25 gauge fine needle
biopsy were then acquired of the right thyroid nodule using
ultrasound guidance. Finally, a 20 gauge needle was used to aspirate
approximately 20 cc of brownish green fluid. Images were stored.

Slide preparation was performed.

Final image was stored after biopsy.

Patient tolerated the procedure well and remained hemodynamically
stable throughout.

No complications were encountered and no significant blood loss was
encounter

COMPLICATIONS:
None.
FINDINGS: Ultrasound survey demonstrates cystic lesion of the right thyroid.

Images during the case demonstrate needle placement along the margin
of the thyroid nodule on each needle pass.

Images also demonstrate aspiration of the cyst.

Final image demonstrates no complicating features.
IMPRESSION: Status post ultrasound-guided biopsy of cystic and solid right
thyroid nodule. Tissue specimen as well as aspirated fluid sent to
the pathology lab for complete histopathologic analysis.

## 2016-12-20 ENCOUNTER — Emergency Department: Admission: EM | Admit: 2016-12-20 | Discharge: 2016-12-20 | Discharge status: Absent without leave | Payer: Self-pay
